# Patient Record
Sex: Male | Born: 1935 | Race: White | Hispanic: No | Smoking: Former smoker
Health system: Southern US, Community
[De-identification: ages and names within clinical notes are randomized; demographics above are authoritative.]

## PROBLEM LIST (undated history)

## (undated) DIAGNOSIS — R030 Elevated blood-pressure reading, without diagnosis of hypertension: Secondary | ICD-10-CM

## (undated) DIAGNOSIS — F0390 Unspecified dementia without behavioral disturbance: Secondary | ICD-10-CM

## (undated) DIAGNOSIS — IMO0001 Reserved for inherently not codable concepts without codable children: Secondary | ICD-10-CM

## (undated) HISTORY — DX: Elevated blood-pressure reading, without diagnosis of hypertension: R03.0

## (undated) HISTORY — PX: MELANOMA EXCISION: SHX5266

## (undated) HISTORY — PX: CYST EXCISION: SHX5701

## (undated) HISTORY — DX: Reserved for inherently not codable concepts without codable children: IMO0001

## (undated) HISTORY — PX: CATARACT EXTRACTION, BILATERAL: SHX1313

## (undated) HISTORY — PX: COLONOSCOPY: SHX174

---

## 1968-03-05 HISTORY — PX: HERNIA REPAIR: SHX51

## 2006-10-25 ENCOUNTER — Ambulatory Visit: Payer: Self-pay | Admitting: Gastroenterology

## 2013-01-26 ENCOUNTER — Ambulatory Visit: Payer: Self-pay | Admitting: Family Medicine

## 2013-06-26 ENCOUNTER — Ambulatory Visit: Payer: Self-pay | Admitting: Orthopedic Surgery

## 2013-07-04 ENCOUNTER — Emergency Department: Payer: Self-pay | Admitting: Emergency Medicine

## 2014-04-17 ENCOUNTER — Emergency Department: Payer: Self-pay | Admitting: Emergency Medicine

## 2014-04-17 DIAGNOSIS — S0093XA Contusion of unspecified part of head, initial encounter: Secondary | ICD-10-CM | POA: Diagnosis not present

## 2014-04-17 DIAGNOSIS — S0990XA Unspecified injury of head, initial encounter: Secondary | ICD-10-CM | POA: Diagnosis not present

## 2014-04-17 DIAGNOSIS — S0191XA Laceration without foreign body of unspecified part of head, initial encounter: Secondary | ICD-10-CM | POA: Diagnosis not present

## 2014-04-17 DIAGNOSIS — S01112A Laceration without foreign body of left eyelid and periocular area, initial encounter: Secondary | ICD-10-CM | POA: Diagnosis not present

## 2014-04-17 DIAGNOSIS — S80212A Abrasion, left knee, initial encounter: Secondary | ICD-10-CM | POA: Diagnosis not present

## 2014-04-17 DIAGNOSIS — Z87891 Personal history of nicotine dependence: Secondary | ICD-10-CM | POA: Diagnosis not present

## 2014-04-19 DIAGNOSIS — I10 Essential (primary) hypertension: Secondary | ICD-10-CM | POA: Diagnosis not present

## 2014-04-19 DIAGNOSIS — I350 Nonrheumatic aortic (valve) stenosis: Secondary | ICD-10-CM | POA: Diagnosis not present

## 2014-04-19 DIAGNOSIS — I2510 Atherosclerotic heart disease of native coronary artery without angina pectoris: Secondary | ICD-10-CM | POA: Diagnosis not present

## 2014-04-23 DIAGNOSIS — S01112S Laceration without foreign body of left eyelid and periocular area, sequela: Secondary | ICD-10-CM | POA: Diagnosis not present

## 2014-04-27 DIAGNOSIS — I350 Nonrheumatic aortic (valve) stenosis: Secondary | ICD-10-CM | POA: Diagnosis not present

## 2014-04-27 DIAGNOSIS — I10 Essential (primary) hypertension: Secondary | ICD-10-CM | POA: Diagnosis not present

## 2014-04-27 DIAGNOSIS — E782 Mixed hyperlipidemia: Secondary | ICD-10-CM | POA: Diagnosis not present

## 2014-04-27 DIAGNOSIS — I2510 Atherosclerotic heart disease of native coronary artery without angina pectoris: Secondary | ICD-10-CM | POA: Diagnosis not present

## 2014-06-10 DIAGNOSIS — R011 Cardiac murmur, unspecified: Secondary | ICD-10-CM | POA: Diagnosis not present

## 2014-06-10 DIAGNOSIS — R5383 Other fatigue: Secondary | ICD-10-CM | POA: Diagnosis not present

## 2014-06-10 DIAGNOSIS — I10 Essential (primary) hypertension: Secondary | ICD-10-CM | POA: Diagnosis not present

## 2014-06-10 LAB — BASIC METABOLIC PANEL
BUN: 17 mg/dL (ref 4–21)
Creatinine: 1.1 mg/dL (ref 0.6–1.3)
Glucose: 94 mg/dL
Potassium: 4.6 mmol/L (ref 3.4–5.3)
Sodium: 143 mmol/L (ref 137–147)

## 2014-06-10 LAB — TSH: TSH: 4.28 u[IU]/mL (ref 0.41–5.90)

## 2014-06-10 LAB — CBC AND DIFFERENTIAL
HEMATOCRIT: 39 % — AB (ref 41–53)
HEMOGLOBIN: 12.9 g/dL — AB (ref 13.5–17.5)
Neutrophils Absolute: 4 /uL
PLATELETS: 214 10*3/uL (ref 150–399)
WBC: 7.6 10^3/mL

## 2014-06-10 LAB — HEPATIC FUNCTION PANEL
ALT: 11 U/L (ref 10–40)
AST: 15 U/L (ref 14–40)
Alkaline Phosphatase: 88 U/L (ref 25–125)
Bilirubin, Total: 0.4 mg/dL

## 2014-08-19 ENCOUNTER — Ambulatory Visit (INDEPENDENT_AMBULATORY_CARE_PROVIDER_SITE_OTHER): Payer: Medicare Other | Admitting: Family Medicine

## 2014-08-19 ENCOUNTER — Encounter: Payer: Self-pay | Admitting: Family Medicine

## 2014-08-19 VITALS — BP 148/62 | HR 67 | Temp 97.8°F | Resp 16 | Wt 164.2 lb

## 2014-08-19 DIAGNOSIS — K4091 Unilateral inguinal hernia, without obstruction or gangrene, recurrent: Secondary | ICD-10-CM | POA: Diagnosis not present

## 2014-08-19 DIAGNOSIS — N481 Balanitis: Secondary | ICD-10-CM | POA: Diagnosis not present

## 2014-08-19 MED ORDER — NYSTATIN 100000 UNIT/GM EX CREA: 1.0000 "application " | TOPICAL_CREAM | Freq: Two times a day (BID) | CUTANEOUS | Status: DC

## 2014-08-19 NOTE — Patient Instructions (Addendum)
Call for referral if hernia becomes bigger or painful Powder genital area after a bath to minimize recurrence of rash

## 2014-08-19 NOTE — Progress Notes (Signed)
Subjective:     Patient ID: Derek Hancock, male   DOB: [birth year], 81 y.o.   MRN: [id number]  HPI  Chief Complaint  Patient presents with  . Skin Problem    Patient comes in office today to discuss redeness and irritation around foreskin of penis he has redness for the past 2 weeks.   Reports that he had similar rash a few years ago which got better with an rx cream. Also has had recurrence of a right inguinal hernia which he states was repaired previously in the 1970's. Not sexually active and is doing little lifting these days per his report: "My kids do it all."   Review of Systems  Constitutional: Negative for fever and chills.  Genitourinary: Negative for dysuria.       Objective:   Physical Exam  Abdominal: A hernia is present. Hernia confirmed positive in the right inguinal area.  Genitourinary: Circumcised. Penile erythema (mild erythema with slight scaling on glans and around corona) present.       Assessment:     1. Balanitis-mild  - nystatin cream (MYCOSTATIN); Apply 1 application topically 2 (two) times daily.  Dispense: 30 g; Refill: 0  2. Unilateral recurrent inguinal hernia without obstruction or gangrene     Plan:    Will monitor hernia for pain or enlargement-patient does not wish surgery at this time. Dicussed powdering groin area after bath

## 2014-09-15 ENCOUNTER — Encounter: Payer: Self-pay | Admitting: Family Medicine

## 2014-09-15 ENCOUNTER — Ambulatory Visit (INDEPENDENT_AMBULATORY_CARE_PROVIDER_SITE_OTHER): Payer: Medicare Other | Admitting: Family Medicine

## 2014-09-15 VITALS — BP 142/60 | HR 64 | Temp 97.4°F | Resp 16 | Wt 165.0 lb

## 2014-09-15 DIAGNOSIS — N481 Balanitis: Secondary | ICD-10-CM

## 2014-09-15 DIAGNOSIS — R011 Cardiac murmur, unspecified: Secondary | ICD-10-CM | POA: Insufficient documentation

## 2014-09-15 DIAGNOSIS — Z85820 Personal history of malignant melanoma of skin: Secondary | ICD-10-CM

## 2014-09-15 DIAGNOSIS — Z87891 Personal history of nicotine dependence: Secondary | ICD-10-CM | POA: Insufficient documentation

## 2014-09-15 DIAGNOSIS — I10 Essential (primary) hypertension: Secondary | ICD-10-CM | POA: Insufficient documentation

## 2014-09-15 NOTE — Progress Notes (Signed)
Patient ID: Derek GuthrieGeorge D Mullenbach, male   DOB: 01-27-1922, 81 y.o.   MRN: [id number]    Subjective:  HPI Pt comes in today because he is having penile pain. He reports that it is red and irriated around the foreskin of his penis. He saw Nadine CountsBob on 08/19/14 and was given Nystatin  Cream and he said that did not work and stopped using it. He reports that some times it burns when he voids.     Prior to Admission medications   Medication Sig Start Date End Date Taking? Authorizing Provider  acetaminophen (TYLENOL) 325 MG tablet Take by mouth.   Yes Historical Provider, MD    Patient Active Problem List   Diagnosis Date Noted  . Blood pressure elevated 09/15/2014  . History of tobacco use 09/15/2014  . Cardiac murmur 09/15/2014    Past Medical History  Diagnosis Date  . Elevated blood pressure     History   Social History  . Marital Status: Married    Spouse Name: N/A  . Number of Children: N/A  . Years of Education: N/A   Occupational History  . Not on file.   Social History Main Topics  . Smoking status: Former Smoker    Quit date: 03/05/1968  . Smokeless tobacco: Not on file  . Alcohol Use: Not on file  . Drug Use: Not on file  . Sexual Activity: Not on file   Other Topics Concern  . Not on file   Social History Narrative    No Known Allergies  Review of Systems  Constitutional: Negative.   HENT: Negative.   Eyes: Negative.   Respiratory: Negative.   Gastrointestinal: Negative.   Genitourinary:       Penile pain and redness  Musculoskeletal: Negative.   Skin: Negative.   Neurological: Negative.   Endo/Heme/Allergies: Negative.   Psychiatric/Behavioral: Negative.     Immunization History  Administered Date(s) Administered  . Td 02/01/2014   Objective:  BP 142/60 mmHg  Pulse 64  Temp(Src) 97.4 F (36.3 C) (Oral)  Resp 16  Wt 165 lb (74.844 kg)  Physical Exam  Constitutional: He is oriented to person, place, and time.  Genitourinary: Penis normal.    Circumcised gentleman. Mild irritation and skin changes consistent with healing balanitis.  Neurological: He is alert and oriented to person, place, and time.  Skin: Skin is warm.  Psychiatric: Mood and affect normal.    Lab Results  Component Value Date   WBC 7.6 06/10/2014   HGB 12.9* 06/10/2014   HCT 39* 06/10/2014   PLT 214 06/10/2014   TSH 4.28 06/10/2014    CMP     Component Value Date/Time   NA 143 06/10/2014   K 4.6 06/10/2014   BUN 17 06/10/2014   CREATININE 1.1 06/10/2014   AST 15 06/10/2014   ALT 11 06/10/2014   ALKPHOS 88 06/10/2014    Assessment and Plan :  Yeast balanitis  History of m elanoma Saw Dr. Orson AloeHenderson. Diagnosed probably 8-10 years ago.  Refer back to his practice. Mild cognitive impairment/early dementia  Julieanne Mansonichard  MD [facility] 09/15/2014 9:40 AM

## 2014-12-15 ENCOUNTER — Encounter: Payer: Self-pay | Admitting: Family Medicine

## 2015-07-11 ENCOUNTER — Telehealth: Payer: Self-pay | Admitting: Family Medicine

## 2015-07-11 NOTE — Telephone Encounter (Signed)
Patient has been notified, follow up appt with Dr. Sullivan LoneGilbert has been arranged

## 2015-07-11 NOTE — Telephone Encounter (Signed)
Wrong provider-aa

## 2015-07-11 NOTE — Telephone Encounter (Signed)
This is a Dr. Sullivan LoneGilbert patient and should be seen by him.

## 2015-07-11 NOTE — Telephone Encounter (Signed)
Dr Sullivan Lonegilbert states patient and son needs to be seen to discuss issues. Son advised through my chart-aa

## 2015-07-11 NOTE — Telephone Encounter (Signed)
FYI

## 2015-07-11 NOTE — Telephone Encounter (Signed)
Son called wanting to talk to you about his father aging and living home by himself and direction for which they need to go.  Some memory loss.  Still gets around ok for 89 but they are just concerned about him falling or something else happening.  His call back is   [phone]  Thanks, Barth Kirksteri

## 2015-07-14 ENCOUNTER — Ambulatory Visit (INDEPENDENT_AMBULATORY_CARE_PROVIDER_SITE_OTHER): Payer: Medicare Other | Admitting: Family Medicine

## 2015-07-14 VITALS — BP 182/78 | HR 68 | Temp 97.7°F | Resp 18 | Wt 164.0 lb

## 2015-07-14 DIAGNOSIS — G459 Transient cerebral ischemic attack, unspecified: Secondary | ICD-10-CM | POA: Diagnosis not present

## 2015-07-14 DIAGNOSIS — I10 Essential (primary) hypertension: Secondary | ICD-10-CM

## 2015-07-14 DIAGNOSIS — E785 Hyperlipidemia, unspecified: Secondary | ICD-10-CM | POA: Diagnosis not present

## 2015-07-14 DIAGNOSIS — G309 Alzheimer's disease, unspecified: Secondary | ICD-10-CM | POA: Diagnosis not present

## 2015-07-14 DIAGNOSIS — F0280 Dementia in other diseases classified elsewhere without behavioral disturbance: Secondary | ICD-10-CM

## 2015-07-14 MED ORDER — HYDROCHLOROTHIAZIDE 12.5 MG PO CAPS: 12.5000 mg | ORAL_CAPSULE | Freq: Every day | ORAL | Status: DC

## 2015-07-14 NOTE — Progress Notes (Signed)
Patient ID: Durenda GuthrieGeorge D Wivell, male   DOB: [birth year], 82 y.o.   MRN: [id number]   Durenda GuthrieGeorge D Bansal  MRN: [id number] DOB: [birth year]  Subjective:  HPI  The patient is a 82 year old male who is brought in with his 2 sons.  They described the patient as having an event while at the beach last week that they think may have been a TIA.  They described the patient walking on the beach and then he was having to walk very fast as he was leaning forward.  They state his legs got weak and were about to go out under him, he was then dependent on their help.  When the appointment was made it was put down that he needed to have his memory checked.  An MMSE was performed on the patient and he scored 13.5/30  It is of note that the patient is still driving at this time.  Patient Active Problem List   Diagnosis Date Noted  . Blood pressure elevated 09/15/2014  . History of tobacco use 09/15/2014  . Cardiac murmur 09/15/2014    Past Medical History  Diagnosis Date  . Elevated blood pressure     Social History   Social History  . Marital Status: Married    Spouse Name: N/A  . Number of Children: N/A  . Years of Education: N/A   Occupational History  . Not on file.   Social History Main Topics  . Smoking status: Former Smoker    Quit date: 03/05/1968  . Smokeless tobacco: Not on file  . Alcohol Use: Not on file  . Drug Use: Not on file  . Sexual Activity: Not on file   Other Topics Concern  . Not on file   Social History Narrative    Outpatient Prescriptions Prior to Visit  Medication Sig Dispense Refill  . acetaminophen (TYLENOL) 325 MG tablet Take by mouth.     No facility-administered medications prior to visit.    No Known Allergies  Review of Systems  Constitutional: Negative for fever and malaise/fatigue.  Eyes: Negative.   Respiratory: Negative for cough, shortness of breath and wheezing.   Cardiovascular: Positive for leg swelling (ankleschronic and unchanged). Negative  for chest pain, palpitations, orthopnea and claudication.  Gastrointestinal: Negative.   Neurological: Positive for weakness. Negative for dizziness, speech change, seizures, loss of consciousness and headaches.  Psychiatric/Behavioral: Positive for depression and memory loss. Negative for suicidal ideas, hallucinations and substance abuse. The patient is not nervous/anxious and does not have insomnia.    Objective:  BP 182/78 mmHg  Pulse 68  Temp(Src) 97.7 F (36.5 C) (Oral)  Resp 18  Wt 164 lb (74.39 kg)  Physical Exam  Constitutional: He is oriented to person, place, and time and well-developed, well-nourished, and in no distress.  HENT:  Head: Normocephalic and atraumatic.  Right Ear: External ear normal.  Left Ear: External ear normal.  Nose: Nose normal.  Neck: Neck supple.  Cardiovascular: Normal rate, regular rhythm and normal heart sounds.    2 left carotid bruit versus radiating murmur.  2/6 systolic murmur at right upper sternal border  Pulmonary/Chest: Effort normal and breath sounds normal.  Abdominal: Soft.  Neurological: He is alert and oriented to person, place, and time. Gait normal.  Skin: Skin is warm and dry.  Psychiatric: Mood and affect normal.    Assessment and Plan :  No diagnosis found.  hypertension  Both sons are with patient today and wish to not treat it  just from today's readings. They  feel as though that he has white coat hypertension. After long discussion we'll start HCTZ 12.5 mg daily  TIA   clinically it does fit the patient had a slight TIA last week at the beach with his family. The pressure as above. May need carotid Dopplers.   aspirin daily imaging or neurology referral at this time.   Alzheimer's disease  Clinical presentation of of cognitive impairment fits Alzheimer's. Happy to refer to patient if family wishes. Other than carotid Dopplers do not think imaging is necessary. Definitely instructed patient and his sons that he should  not be driving.  I offered home health referral but he declined. I will see him back in 1 month regarding these issues. I have done the exam and reviewed the above chart and it is accurate to the best of my knowledge.  Julieanne Manson MD [facility] 07/14/2015 4:17 PM

## 2015-07-19 DIAGNOSIS — G459 Transient cerebral ischemic attack, unspecified: Secondary | ICD-10-CM | POA: Diagnosis not present

## 2015-07-19 DIAGNOSIS — E785 Hyperlipidemia, unspecified: Secondary | ICD-10-CM | POA: Diagnosis not present

## 2015-07-19 DIAGNOSIS — G309 Alzheimer's disease, unspecified: Secondary | ICD-10-CM | POA: Diagnosis not present

## 2015-07-19 DIAGNOSIS — I10 Essential (primary) hypertension: Secondary | ICD-10-CM | POA: Diagnosis not present

## 2015-07-20 LAB — COMPREHENSIVE METABOLIC PANEL
ALBUMIN: 4.3 g/dL (ref 3.2–4.6)
ALK PHOS: 78 IU/L (ref 39–117)
ALT: 8 IU/L (ref 0–44)
AST: 13 IU/L (ref 0–40)
Albumin/Globulin Ratio: 2 (ref 1.2–2.2)
BUN/Creatinine Ratio: 21 (ref 10–24)
BUN: 25 mg/dL (ref 10–36)
Bilirubin Total: 0.4 mg/dL (ref 0.0–1.2)
CO2: 27 mmol/L (ref 18–29)
CREATININE: 1.2 mg/dL (ref 0.76–1.27)
Calcium: 9.1 mg/dL (ref 8.6–10.2)
Chloride: 98 mmol/L (ref 96–106)
GFR calc Af Amer: 61 mL/min/{1.73_m2} (ref >59)
GFR calc non Af Amer: 53 mL/min/{1.73_m2} — ABNORMAL LOW (ref >59)
GLUCOSE: 89 mg/dL (ref 65–99)
Globulin, Total: 2.1 g/dL (ref 1.5–4.5)
Potassium: 4.2 mmol/L (ref 3.5–5.2)
Sodium: 140 mmol/L (ref 134–144)
Total Protein: 6.4 g/dL (ref 6.0–8.5)

## 2015-07-20 LAB — CBC WITH DIFFERENTIAL/PLATELET
BASOS ABS: 0 10*3/uL (ref 0.0–0.2)
Basos: 0 %
EOS (ABSOLUTE): 0.1 10*3/uL (ref 0.0–0.4)
Eos: 1 %
HEMOGLOBIN: 12.1 g/dL — AB (ref 12.6–17.7)
Hematocrit: 36.8 % — ABNORMAL LOW (ref 37.5–51.0)
IMMATURE GRANULOCYTES: 0 %
Immature Grans (Abs): 0 10*3/uL (ref 0.0–0.1)
LYMPHS ABS: 2.3 10*3/uL (ref 0.7–3.1)
LYMPHS: 24 %
MCH: 30.9 pg (ref 26.6–33.0)
MCHC: 32.9 g/dL (ref 31.5–35.7)
MCV: 94 fL (ref 79–97)
MONOCYTES: 9 %
Monocytes Absolute: 0.8 10*3/uL (ref 0.1–0.9)
NEUTROS PCT: 66 %
Neutrophils Absolute: 6.4 10*3/uL (ref 1.4–7.0)
Platelets: 205 10*3/uL (ref 150–379)
RBC: 3.92 x10E6/uL — AB (ref 4.14–5.80)
RDW: 14.2 % (ref 12.3–15.4)
WBC: 9.7 10*3/uL (ref 3.4–10.8)

## 2015-07-20 LAB — LIPID PANEL
CHOLESTEROL TOTAL: 168 mg/dL (ref 100–199)
Chol/HDL Ratio: 2.3 ratio units (ref 0.0–5.0)
HDL: 74 mg/dL (ref >39)
LDL CALC: 77 mg/dL (ref 0–99)
TRIGLYCERIDES: 83 mg/dL (ref 0–149)
VLDL CHOLESTEROL CAL: 17 mg/dL (ref 5–40)

## 2015-07-20 LAB — TSH: TSH: 2.27 u[IU]/mL (ref 0.450–4.500)

## 2015-07-27 DIAGNOSIS — L570 Actinic keratosis: Secondary | ICD-10-CM | POA: Diagnosis not present

## 2015-07-27 DIAGNOSIS — L821 Other seborrheic keratosis: Secondary | ICD-10-CM | POA: Diagnosis not present

## 2015-07-27 DIAGNOSIS — Z86008 Personal history of in-situ neoplasm of other site: Secondary | ICD-10-CM | POA: Diagnosis not present

## 2015-08-11 ENCOUNTER — Ambulatory Visit (INDEPENDENT_AMBULATORY_CARE_PROVIDER_SITE_OTHER): Payer: Medicare Other | Admitting: Family Medicine

## 2015-08-11 ENCOUNTER — Encounter: Payer: Self-pay | Admitting: Family Medicine

## 2015-08-11 VITALS — BP 144/64 | HR 57 | Temp 97.4°F | Resp 16 | Wt 163.0 lb

## 2015-08-11 DIAGNOSIS — R0989 Other specified symptoms and signs involving the circulatory and respiratory systems: Secondary | ICD-10-CM

## 2015-08-11 DIAGNOSIS — F0390 Unspecified dementia without behavioral disturbance: Secondary | ICD-10-CM

## 2015-08-11 DIAGNOSIS — I10 Essential (primary) hypertension: Secondary | ICD-10-CM

## 2015-08-11 MED ORDER — HYDROCHLOROTHIAZIDE 12.5 MG PO CAPS: 12.5000 mg | ORAL_CAPSULE | Freq: Every day | ORAL | Status: DC

## 2015-08-11 MED ORDER — DONEPEZIL HCL 5 MG PO TABS: 5.0000 mg | ORAL_TABLET | Freq: Every day | ORAL | Status: DC

## 2015-08-11 NOTE — Progress Notes (Signed)
Patient: Derek Hancock Male    DOB: 01-07-1922   82 y.o.   MRN: [id number] Visit Date: 08/11/2015  Today's Provider: Megan Mansichard Gilbert Jr, MD   Chief Complaint  Patient presents with  . Follow-up  . Hypertension   Subjective:    HPI    Alzheimer's disease Clinical presentation of of cognitive impairment fits Alzheimer's. Happy to refer to patient if family wishes. Other than carotid Dopplers do not think imaging is necessary. Definitely instructed patient and his sons that he should not be driving. I offered home health referral but he declined. I will see him back in 1 month regarding these issues.   TIA  May need carotid Dopplers. 81mg  aspirin daily imaging or neurology referral at this time.      Hypertension, follow-up:  BP Readings from Last 3 Encounters:  08/11/15 144/64  07/14/15 182/78  09/15/14 142/60    He was last seen for hypertension 1 months ago.  BP at that visit was 182/78. Management since that visit includes; started HCTZ 12.5 mg qd .He reports good compliance with treatment. He is not having side effects. none  He is not exercising. He is adherent to low salt diet.   Outside blood pressures are n/a. He is experiencing none.  Patient denies none.   Cardiovascular risk factors include none.  Use of agents associated with hypertension: none.   ----------------------------------------------------------------------     No Known Allergies Current Meds  Medication Sig  . acetaminophen (TYLENOL) 325 MG tablet Take by mouth.  Marland Kitchen. aspirin 81 MG tablet Take 81 mg by mouth daily.  . hydrochlorothiazide (MICROZIDE) 12.5 MG capsule Take 1 capsule (12.5 mg total) by mouth daily.    Review of Systems  Constitutional: Negative for fever, chills and appetite change.  Respiratory: Negative for chest tightness, shortness of breath and wheezing.   Cardiovascular: Negative for chest pain and palpitations.  Gastrointestinal: Negative for nausea,  vomiting and abdominal pain.    Social History  Substance Use Topics  . Smoking status: Former Smoker    Quit date: 03/05/1968  . Smokeless tobacco: Not on file  . Alcohol Use: Not on file   Objective:   BP 144/64 mmHg  Pulse 57  Temp(Src) 97.4 F (36.3 C) (Oral)  Resp 16  Wt 163 lb (73.936 kg)  SpO2 99%  Physical Exam  Constitutional: He appears well-developed and well-nourished.  HENT:  Head: Normocephalic and atraumatic.  Right Ear: External ear normal.  Left Ear: External ear normal.  Nose: Nose normal.  Eyes: Conjunctivae are normal.  Neck: Neck supple. No thyromegaly present.  Cardiovascular: Normal rate, regular rhythm and normal heart sounds.   2/6 murmur.  Pulmonary/Chest: Effort normal and breath sounds normal.  Abdominal: Soft.  Neurological: He is alert. No cranial nerve deficit. He exhibits normal muscle tone. Coordination normal.  Skin: Skin is warm and dry.  Psychiatric: He has a normal mood and affect. His behavior is normal. Judgment and thought content normal.        Assessment & Plan:     1. Essential hypertension Much better control on low-dose diuretic. - hydrochlorothiazide (MICROZIDE) 12.5 MG capsule; Take 1 capsule (12.5 mg total) by mouth daily.  Dispense: 90 capsule; Refill: 5  2. Dementia, without behavioral disturbance MMSE on next OV.Patient is doing well. Family is very kind and supportive. He is doing fine not driving. - donepezil (ARICEPT) 5 MG tablet; Take 1 tablet (5 mg total) by mouth at bedtime.  Dispense: 30 tablet; Refill: 12  3. Bruit of left carotid artery Could be radiating  murmur but need to make sure. - US Carotid Bilateral; Future       Megan Mans, MD  [facility]

## 2015-08-19 ENCOUNTER — Ambulatory Visit
Admission: RE | Admit: 2015-08-19 | Discharge: 2015-08-19 | Disposition: A | Discharge status: Home / Self Care | Payer: Medicare Other | Source: Ambulatory Visit | Attending: Family Medicine | Admitting: Family Medicine

## 2015-08-19 DIAGNOSIS — R0989 Other specified symptoms and signs involving the circulatory and respiratory systems: Secondary | ICD-10-CM | POA: Insufficient documentation

## 2015-08-19 DIAGNOSIS — I6523 Occlusion and stenosis of bilateral carotid arteries: Secondary | ICD-10-CM | POA: Diagnosis not present

## 2015-08-29 DIAGNOSIS — I2510 Atherosclerotic heart disease of native coronary artery without angina pectoris: Secondary | ICD-10-CM | POA: Diagnosis not present

## 2015-08-29 DIAGNOSIS — I351 Nonrheumatic aortic (valve) insufficiency: Secondary | ICD-10-CM | POA: Diagnosis not present

## 2015-08-29 DIAGNOSIS — I350 Nonrheumatic aortic (valve) stenosis: Secondary | ICD-10-CM | POA: Diagnosis not present

## 2015-08-29 DIAGNOSIS — I10 Essential (primary) hypertension: Secondary | ICD-10-CM | POA: Diagnosis not present

## 2015-08-29 DIAGNOSIS — I340 Nonrheumatic mitral (valve) insufficiency: Secondary | ICD-10-CM | POA: Diagnosis not present

## 2015-10-20 DIAGNOSIS — M898X9 Other specified disorders of bone, unspecified site: Secondary | ICD-10-CM | POA: Diagnosis not present

## 2015-10-20 DIAGNOSIS — M79674 Pain in right toe(s): Secondary | ICD-10-CM | POA: Diagnosis not present

## 2015-10-20 DIAGNOSIS — M79675 Pain in left toe(s): Secondary | ICD-10-CM | POA: Diagnosis not present

## 2015-10-20 DIAGNOSIS — M2042 Other hammer toe(s) (acquired), left foot: Secondary | ICD-10-CM | POA: Diagnosis not present

## 2015-10-20 DIAGNOSIS — B351 Tinea unguium: Secondary | ICD-10-CM | POA: Diagnosis not present

## 2016-01-11 ENCOUNTER — Encounter: Payer: Self-pay | Admitting: Family Medicine

## 2016-01-11 ENCOUNTER — Ambulatory Visit (INDEPENDENT_AMBULATORY_CARE_PROVIDER_SITE_OTHER): Payer: Medicare Other | Admitting: Family Medicine

## 2016-01-11 VITALS — BP 142/62 | HR 66 | Temp 97.7°F | Ht 67.0 in | Wt 160.2 lb

## 2016-01-11 VITALS — BP 156/62 | HR 60 | Resp 16

## 2016-01-11 DIAGNOSIS — F0390 Unspecified dementia without behavioral disturbance: Secondary | ICD-10-CM | POA: Diagnosis not present

## 2016-01-11 DIAGNOSIS — I350 Nonrheumatic aortic (valve) stenosis: Secondary | ICD-10-CM

## 2016-01-11 DIAGNOSIS — Z0000 Encounter for general adult medical examination without abnormal findings: Secondary | ICD-10-CM

## 2016-01-11 DIAGNOSIS — I10 Essential (primary) hypertension: Secondary | ICD-10-CM

## 2016-01-11 DIAGNOSIS — K4090 Unilateral inguinal hernia, without obstruction or gangrene, not specified as recurrent: Secondary | ICD-10-CM

## 2016-01-11 MED ORDER — DONEPEZIL HCL 10 MG PO TABS: 10.0000 mg | ORAL_TABLET | Freq: Every day | ORAL | 3 refills | Status: DC

## 2016-01-11 NOTE — Progress Notes (Signed)
Subjective:   Derek Hancock is a 82 y.o. male who presents for Medicare Annual/Subsequent preventive examination.  Review of Systems:  Progressive memory loss/dementia. Patient is brought in by his son. He is no longer driving. Cardiac Risk Factors include: advanced age (>7455men, 12>65 women);hypertension;male gender     Objective:    Vitals: BP (!) 142/62 (BP Location: Right Arm)   Pulse 66   Temp 97.7 F (36.5 C) (Oral)   Ht 5\' 7"  (1.702 m)   Wt 160 lb 4 oz (72.7 kg)   BMI 25.10 kg/m   Body mass index is 25.1 kg/m.  Tobacco History  Smoking Status  . Former Smoker  . Quit date: 03/05/1968  Smokeless Tobacco  . Never Used     Counseling given: Not Answered   Past Medical History:  Diagnosis Date  . Elevated blood pressure    Past Surgical History:  Procedure Laterality Date  . CATARACT EXTRACTION, BILATERAL    . CATARACT EXTRACTION, BILATERAL    . CYST EXCISION     at the end of the spine  . HERNIA REPAIR  1970  . MELANOMA EXCISION    . MELANOMA EXCISION     Family History  Problem Relation Age of Onset  . Heart disease Mother   . Heart disease Father    History  Sexual Activity  . Sexual activity: Not on file    Outpatient Encounter Prescriptions as of 01/11/2016  Medication Sig  . acetaminophen (TYLENOL) 325 MG tablet Take by mouth.  Marland Kitchen. aspirin 81 MG tablet Take 81 mg by mouth daily.  Marland Kitchen. donepezil (ARICEPT) 5 MG tablet Take 1 tablet (5 mg total) by mouth at bedtime.  . hydrochlorothiazide (MICROZIDE) 12.5 MG capsule Take 1 capsule (12.5 mg total) by mouth daily.   No facility-administered encounter medications on file as of 01/11/2016.     Activities of Daily Living In your present state of health, do you have any difficulty performing the following activities: 01/11/2016 07/14/2015  Hearing? N N  Vision? N N  Difficulty concentrating or making decisions? N Y  Walking or climbing stairs? N N  Dressing or bathing? N N  Doing errands, shopping? N N    Preparing Food and eating ? N -  Using the Toilet? N -  In the past six months, have you accidently leaked urine? N -  Do you have problems with loss of bowel control? N -  Managing your Medications? N -  Managing your Finances? (No Data) -  Housekeeping or managing your Housekeeping? Y -  Some recent data might be hidden    Patient Care Team: Derek Hudsonichard L Hancock Jr., MD as PCP - General (Family Medicine) Derek PereyraPhillip T. Alvester MorinBell, MD as Consulting Physician (Ophthalmology) Derek BlinksBruce J Kowalski, MD as Consulting Physician (Cardiology)   Assessment:     Exercise Activities and Dietary recommendations Current Exercise Habits: The patient does not participate in regular exercise at present, Exercise limited by: None identified (does not drive)  Goals    . Increase water intake          Starting 01/11/16, I will increase my water intake to 4 glasses a day.       Fall Risk Fall Risk  01/11/2016 07/14/2015  Falls in the past year? No Yes  Number falls in past yr: - 1  Injury with Fall? - No   Depression Screen PHQ 2/9 Scores 01/11/2016  PHQ - 2 Score 0    Cognitive Function  6CIT Screen 01/11/2016  What Year? 4 points  What month? 0 points  What time? 0 points  Count back from 20 2 points  Months in reverse 4 points  Repeat phrase 6 points  Total Score 16    Immunization History  Administered Date(s) Administered  . Influenza-Unspecified 11/30/2015  . Td 02/01/2014   Screening Tests Health Maintenance  Topic Date Due  . PNA vac Low Risk Adult (1 of 2 - PCV13) 03/04/2016 (Originally 10/14/1988)  . ZOSTAVAX  01/10/2017 (Originally 10/15/1983)  . TETANUS/TDAP  02/02/2024  . INFLUENZA VACCINE  Completed      Plan:  I have personally reviewed and addressed the Medicare Annual Wellness questionnaire and have noted the following in the patient's chart:  A. Medical and social history B. Use of alcohol, tobacco or illicit drugs  C. Current medications and  supplements D. Functional ability and status E.  Nutritional status F.  Physical activity G. Advance directives H. List of other physicians I.  Hospitalizations, surgeries, and ER visits in previous 12 months J.  Vitals K. Screenings such as hearing and vision if needed, cognitive and depression L. Referrals and appointments - none  In addition, I have reviewed and discussed with patient certain preventive protocols, quality metrics, and best practice recommendations. A written personalized care plan for preventive services as well as general preventive health recommendations were provided to patient.  See attached scanned questionnaire for additional information.   Signed,  Derek Hancock, Derek Hancock  I have reviewed the information as  put it by [facility]big[facility] Radich Hancock and was available for any questions or concerns. Derek Hancock

## 2016-01-11 NOTE — Patient Instructions (Signed)
Mr. Derek Hancock , Thank you for taking time to come for your Medicare Wellness Visit. I appreciate your ongoing commitment to your health goals. Please review the following plan we discussed and let me know if I can assist you in the future.   These are the goals we discussed: Goals    . Increase water intake          Starting 01/11/16, I will increase my water intake to 4 glasses a day.        This is a list of the screening recommended for you and due dates:  Health Maintenance  Topic Date Due  . Pneumonia vaccines (1 of 2 - PCV13) 03/04/2016*  . Shingles Vaccine  01/10/2017*  . Tetanus Vaccine  02/02/2024  . Flu Shot  Completed  *Topic was postponed. The date shown is not the original due date.   Preventive Care for Adults  A healthy lifestyle and preventive care can promote health and wellness. Preventive health guidelines for adults include the following key practices.  . A routine yearly physical is a good way to check with your health care provider about your health and preventive screening. It is a chance to share any concerns and updates on your health and to receive a thorough exam.  . Visit your dentist for a routine exam and preventive care every 6 months. Brush your teeth twice a day and floss once a day. Good oral hygiene prevents tooth decay and gum disease.  . The frequency of eye exams is based on your age, health, family medical history, use  of contact lenses, and other factors. Follow your health care provider's ecommendations for frequency of eye exams.  . Eat a healthy diet. Foods like vegetables, fruits, whole grains, low-fat dairy products, and lean protein foods contain the nutrients you need without too many calories. Decrease your intake of foods high in solid fats, added sugars, and salt. Eat the right amount of calories for you. Get information about a proper diet from your health care provider, if necessary.  . Regular physical exercise is one of the most  important things you can do for your health. Most adults should get at least 150 minutes of moderate-intensity exercise (any activity that increases your heart rate and causes you to sweat) each week. In addition, most adults need muscle-strengthening exercises on 2 or more days a week.  Silver Sneakers may be a benefit available to you. To determine eligibility, you may visit the website: www.silversneakers.com or contact program at [phone] Mon-Fri between 8AM-8PM.   . Maintain a healthy weight. The body mass index (BMI) is a screening tool to identify possible weight problems. It provides an estimate of body fat based on height and weight. Your health care provider can find your BMI and can help you achieve or maintain a healthy weight.   For adults 20 years and older: ? A BMI below 18.5 is considered underweight. ? A BMI of 18.5 to 24.9 is normal. ? A BMI of 25 to 29.9 is considered overweight. ? A BMI of 30 and above is considered obese.   . Maintain normal blood lipids and cholesterol levels by exercising and minimizing your intake of saturated fat. Eat a balanced diet with plenty of fruit and vegetables. Blood tests for lipids and cholesterol should begin at age 80 and be repeated every 5 years. If your lipid or cholesterol levels are high, you are over 50, or you are at high risk for heart disease, you  may need your cholesterol levels checked more frequently. Ongoing high lipid and cholesterol levels should be treated with medicines if diet and exercise are not working.  . If you smoke, find out from your health care provider how to quit. If you do not use tobacco, please do not start.  . If you choose to drink alcohol, please do not consume more than 2 drinks per day. One drink is considered to be 12 ounces (355 mL) of beer, 5 ounces (148 mL) of wine, or 1.5 ounces (44 mL) of liquor.  . If you are 3055-658 years old, ask your health care provider if you should take aspirin to prevent  strokes.  . Use sunscreen. Apply sunscreen liberally and repeatedly throughout the day. You should seek shade when your shadow is shorter than you. Protect yourself by wearing long sleeves, pants, a wide-brimmed hat, and sunglasses year round, whenever you are outdoors.  . Once a month, do a whole body skin exam, using a mirror to look at the skin on your back. Tell your health care provider of new moles, moles that have irregular borders, moles that are larger than a pencil eraser, or moles that have changed in shape or color.

## 2016-01-11 NOTE — Progress Notes (Signed)
Patient: Derek Hancock Male    DOB: [birth year]   82 y.o.   MRN: [id number] Visit Date: 01/11/2016  Today's Provider: Megan Mansichard  Jr, MD   Chief Complaint  Patient presents with  . Hypertension  . Dementia   Subjective:    HPI The major quality-of-life issue is progressive dementia. Patient recognizes that his cognition is failing. Son is here with him. He is not driving and appears to be safe. He still lives in his his own home. New issue today is a discomfort and little bit of swelling when his he has been on his  feet in the groin area.     Hypertension, follow-up:  BP Readings from Last 3 Encounters:  01/11/16 (!) 156/62  01/11/16 (!) 142/62  08/11/15 (!) 144/64    He was last seen for hypertension 5 months ago.  BP at that visit was 144/64. Management since that visit includes no changes made. He reports excellent compliance with treatment. He is not having side effects.  He is exercising. He is adherent to low salt diet.   Outside blood pressures are not being checked. He is experiencing lower extremity edema.  Patient denies chest pain, dyspnea, fatigue, irregular heart beat, near-syncope, orthopnea, palpitations and syncope.   Cardiovascular risk factors include advanced age (older than 7055 for men, 7065 for women), hypertension and male gender.       Weight trend: decreasing steadily Wt Readings from Last 3 Encounters:  01/11/16 160 lb 4 oz (72.7 kg)  08/11/15 163 lb (73.9 kg)  07/14/15 164 lb (74.4 kg)    Current diet: in general, a "healthy" diet    ------------------------------------------------------------------------ Dementia Follow Up  MMSE - Mini Mental State Exam 01/11/2016  Orientation to time 2  Orientation to Place 4  Registration 1  Attention/ Calculation 0  Recall 2  Language- name 2 objects 2  Language- repeat 1  Language- follow 3 step command 3  Language- read & follow direction 1  Write a sentence 0  Copy design 0    Total score 16   Is taking Aricept 5 mg tablet daily for this.  No Known Allergies   Current Outpatient Prescriptions:  .  acetaminophen (TYLENOL) 325 MG tablet, Take by mouth., Disp: , Rfl:  .  aspirin 81 MG tablet, Take 81 mg by mouth daily., Disp: , Rfl:  .  donepezil (ARICEPT) 5 MG tablet, Take 1 tablet (5 mg total) by mouth at bedtime., Disp: 30 tablet, Rfl: 12 .  hydrochlorothiazide (MICROZIDE) 12.5 MG capsule, Take 1 capsule (12.5 mg total) by mouth daily., Disp: 90 capsule, Rfl: 5 .  FLUZONE HIGH-DOSE 0.5 ML SUSY, , Disp: , Rfl:   Review of Systems  Constitutional: Negative for activity change, appetite change, chills, diaphoresis, fatigue, fever and unexpected weight change.  Eyes: Negative.   Respiratory: Negative for shortness of breath.   Cardiovascular: Positive for leg swelling. Negative for chest pain and palpitations.  Endocrine: Negative.   Genitourinary:       Mild swelling and discomfort and groin recently.  Allergic/Immunologic: Negative.   Neurological: Negative.   Psychiatric/Behavioral: Negative.     Social History  Substance Use Topics  . Smoking status: Former Smoker    Quit date: 03/05/1968  . Smokeless tobacco: Never Used  . Alcohol use No   Objective:   BP (!) 156/62 (BP Location: Right Arm, Patient Position: Sitting, Cuff Size: Normal)   Pulse 60   Resp 16  Physical Exam  Constitutional: He is oriented to person, place, and time. He appears well-developed and well-nourished.  HENT:  Head: Normocephalic and atraumatic.  Eyes: Conjunctivae are normal.  Neck: Neck supple. No thyromegaly present.  Cardiovascular: Normal rate, regular rhythm and normal heart sounds.   No carotid Bruit  Pulmonary/Chest: Effort normal and breath sounds normal. No respiratory distress.  Abdominal: Soft.  Reducible hernia on right inguinal area.  Genitourinary:  Genitourinary Comments: Reducible small-to-moderate inguinal hernia noted  Neurological: He is alert  and oriented to person, place, and time.  Skin: Skin is warm and dry.  Psychiatric: He has a normal mood and affect. His behavior is normal.        Assessment & Plan:      1. Essential hypertension Stable. FU 6 months or sooner if needed.  2. Dementia without behavioral disturbance, unspecified dementia type Increase Aricept from 5 mg to 10 mg as below. Have discussed with both sons and they are aware this will be progressive. Consider adding Namenda on next visit.More than 50% of this visit is spent in counseling regarding the natural progression of disease. - donepezil (ARICEPT) 10 MG tablet; Take 1 tablet (10 mg total) by mouth at bedtime.  Dispense: 90 tablet; Refill: 3  3. Unilateral inguinal hernia without obstruction or gangrene, recurrence not specified New problem. Refer as below. Advised son to weigh benefits vs risks of surgery, as pt is 82 YO. Can cancel referral if pt and son prefer. - Ambulatory referral to General Surgery  4. Aortic valve stenosis, etiology of cardiac valve disease unspecified FU with cardiology as scheduled.     Patient seen and examined by Julieanne Mansonichard , MD, and note scribed by Allene DillonEmily Drozdowski, CMA.  Hasson Gaspard Wendelyn Breslow Jr, MD  [facility]

## 2016-01-12 ENCOUNTER — Encounter: Payer: Self-pay | Admitting: *Deleted

## 2016-01-19 ENCOUNTER — Ambulatory Visit: Payer: Self-pay | Admitting: General Surgery

## 2016-01-23 ENCOUNTER — Ambulatory Visit (INDEPENDENT_AMBULATORY_CARE_PROVIDER_SITE_OTHER): Payer: Medicare Other | Admitting: General Surgery

## 2016-01-23 ENCOUNTER — Encounter: Payer: Self-pay | Admitting: General Surgery

## 2016-01-23 VITALS — BP 122/66 | HR 68 | Resp 14 | Ht 66.0 in | Wt 151.0 lb

## 2016-01-23 DIAGNOSIS — K4031 Unilateral inguinal hernia, with obstruction, without gangrene, recurrent: Secondary | ICD-10-CM

## 2016-01-23 NOTE — Patient Instructions (Addendum)
Observe for any new or worsening symptoms with right inguinal hernia.  Inguinal Hernia, Adult Introduction An inguinal hernia is when fat or the intestines push through the area where the leg meets the lower belly (groin) and make a rounded lump (bulge). This condition happens over time. There are three types of inguinal hernias. These types include:  Hernias that can be pushed back into the belly (are reducible).  Hernias that cannot be pushed back into the belly (are incarcerated).  Hernias that cannot be pushed back into the belly and lose their blood supply (get strangulated). This type needs emergency surgery. Follow these instructions at home: Lifestyle  Drink enough fluid to keep your urine (pee) clear or pale yellow.  Eat plenty of fruits, vegetables, and whole grains. These have a lot of fiber. Talk with your doctor if you have questions.  Avoid lifting heavy objects.  Avoid standing for long periods of time.  Do not use tobacco products. These include cigarettes, chewing tobacco, or e-cigarettes. If you need help quitting, ask your doctor.  Try to stay at a healthy weight. General instructions  Do not try to force the hernia back in.  Watch your hernia for any changes in color or size. Let your doctor know if there are any changes.  Take over-the-counter and prescription medicines only as told by your doctor.  Keep all follow-up visits as told by your doctor. This is important. Contact a doctor if:  You have a fever.  You have new symptoms.  Your symptoms get worse. Get help right away if:  The area where the legs meets the lower belly has:  Pain that gets worse suddenly.  A bulge that gets bigger suddenly and does not go down.  A bulge that turns red or purple.  A bulge that is painful to the touch.  You are a man and your scrotum:  Suddenly feels painful.  Suddenly changes in size.  You feel sick to your stomach (nauseous) and this feeling does  not go away.  You throw up (vomit) and this keeps happening.  You feel your heart beating a lot more quickly than normal.  You cannot poop (have a bowel movement) or pass gas. This information is not intended to replace advice given to you by your health care provider. Make sure you discuss any questions you have with your health care provider. Document Released: 03/22/2006 Document Revised: 07/28/2015 Document Reviewed: 12/30/2013  2017 Elsevier

## 2016-01-23 NOTE — Progress Notes (Signed)
Patient ID: Derek GuthrieGeorge D Hancock, male   DOB: October 20, 1921, 82 y.o.   MRN: [id number]  Chief Complaint  Patient presents with  . Other    inguinal hernia    HPI Derek GuthrieGeorge D Hancock is a 82 y.o. male here today for a evaluation of a inguinal hernia. Patient states he noticed this area about a month ago. He states his right inguinal area was burning for several weeks but is no longer burning. He states the area swells but does not complain of much pain. History of bilateral inguinal hernia repairs in 1970. His son is present at visit. I have reviewed the history of present illness with the patient.  HPI  Past Medical History:  Diagnosis Date  . Elevated blood pressure     Past Surgical History:  Procedure Laterality Date  . CATARACT EXTRACTION, BILATERAL    . COLONOSCOPY    . CYST EXCISION     at the end of the spine  . HERNIA REPAIR  1970  . MELANOMA EXCISION      Family History  Problem Relation Age of Onset  . Heart disease Mother   . Heart disease Father     Social History Social History  Substance Use Topics  . Smoking status: Former Smoker    Quit date: 03/05/1968  . Smokeless tobacco: Never Used  . Alcohol use No    No Known Allergies  Current Outpatient Prescriptions  Medication Sig Dispense Refill  . acetaminophen (TYLENOL) 325 MG tablet Take by mouth.    Marland Kitchen. aspirin 81 MG tablet Take 81 mg by mouth daily.    Marland Kitchen. donepezil (ARICEPT) 10 MG tablet Take 1 tablet (10 mg total) by mouth at bedtime. 90 tablet 3  . FLUZONE HIGH-DOSE 0.5 ML SUSY     . hydrochlorothiazide (MICROZIDE) 12.5 MG capsule Take 1 capsule (12.5 mg total) by mouth daily. 90 capsule 5   No current facility-administered medications for this visit.     Review of Systems Review of Systems  Constitutional: Negative.   Respiratory: Negative.   Cardiovascular: Negative.     Blood pressure 122/66, pulse 68, resp. rate 14, height 5\' 6"  (1.676 m), weight 151 lb (68.5 kg).  Physical Exam Physical Exam   Constitutional: He is oriented to person, place, and time. He appears well-developed and well-nourished.  Eyes: Conjunctivae are normal. No scleral icterus.  Neck: Neck supple.  Cardiovascular: Normal rate, regular rhythm and normal heart sounds.   Pulmonary/Chest: Effort normal and breath sounds normal.  Abdominal: Soft. Bowel sounds are normal. He exhibits no mass. There is no tenderness. A hernia is present. Hernia confirmed positive in the right inguinal area ( right inguinal hernia visible with standing, reducible). Hernia confirmed negative in the left inguinal area.  Healed incisions in both inguinal regions  Lymphadenopathy:    He has no cervical adenopathy.  Neurological: He is alert and oriented to person, place, and time.  Skin: Skin is warm and dry.    Data Reviewed None  Assessment    Right inguinal hernia, reducible. This appears to be a direct hernia, seen best when he stands up, reduces spontaneously when lying down.     Plan    Patient is agreeable to watch right inguinal hernia for any new or worsening symptoms.  Hernia precautions and incarceration were discussed with the patient. If he develops symptoms of an incarcerated hernia, he was encouraged to seek prompt medical attention.      This information has been scribed by  Ples SpecterJessica Qualls CMA.  Jenea Dake G 01/31/2016, 8:02 AM

## 2016-01-31 ENCOUNTER — Encounter: Payer: Self-pay | Admitting: General Surgery

## 2016-02-01 DIAGNOSIS — L821 Other seborrheic keratosis: Secondary | ICD-10-CM | POA: Diagnosis not present

## 2016-02-01 DIAGNOSIS — Z85828 Personal history of other malignant neoplasm of skin: Secondary | ICD-10-CM | POA: Diagnosis not present

## 2016-02-01 DIAGNOSIS — L570 Actinic keratosis: Secondary | ICD-10-CM | POA: Diagnosis not present

## 2016-02-01 DIAGNOSIS — L219 Seborrheic dermatitis, unspecified: Secondary | ICD-10-CM | POA: Diagnosis not present

## 2016-02-02 ENCOUNTER — Encounter: Payer: Self-pay | Admitting: Family Medicine

## 2016-03-01 DIAGNOSIS — I2510 Atherosclerotic heart disease of native coronary artery without angina pectoris: Secondary | ICD-10-CM | POA: Diagnosis not present

## 2016-03-01 DIAGNOSIS — R001 Bradycardia, unspecified: Secondary | ICD-10-CM | POA: Diagnosis not present

## 2016-03-01 DIAGNOSIS — I10 Essential (primary) hypertension: Secondary | ICD-10-CM | POA: Diagnosis not present

## 2016-03-01 DIAGNOSIS — I6523 Occlusion and stenosis of bilateral carotid arteries: Secondary | ICD-10-CM | POA: Insufficient documentation

## 2016-03-07 DIAGNOSIS — R001 Bradycardia, unspecified: Secondary | ICD-10-CM | POA: Diagnosis not present

## 2016-03-26 DIAGNOSIS — I471 Supraventricular tachycardia, unspecified: Secondary | ICD-10-CM | POA: Insufficient documentation

## 2016-03-26 DIAGNOSIS — M79675 Pain in left toe(s): Secondary | ICD-10-CM | POA: Diagnosis not present

## 2016-03-26 DIAGNOSIS — I2510 Atherosclerotic heart disease of native coronary artery without angina pectoris: Secondary | ICD-10-CM | POA: Diagnosis not present

## 2016-03-26 DIAGNOSIS — R600 Localized edema: Secondary | ICD-10-CM | POA: Diagnosis not present

## 2016-03-26 DIAGNOSIS — I350 Nonrheumatic aortic (valve) stenosis: Secondary | ICD-10-CM | POA: Diagnosis not present

## 2016-03-26 DIAGNOSIS — B351 Tinea unguium: Secondary | ICD-10-CM | POA: Diagnosis not present

## 2016-03-26 DIAGNOSIS — M79674 Pain in right toe(s): Secondary | ICD-10-CM | POA: Diagnosis not present

## 2016-07-05 DIAGNOSIS — B351 Tinea unguium: Secondary | ICD-10-CM | POA: Diagnosis not present

## 2016-07-05 DIAGNOSIS — M79674 Pain in right toe(s): Secondary | ICD-10-CM | POA: Diagnosis not present

## 2016-07-05 DIAGNOSIS — L97521 Non-pressure chronic ulcer of other part of left foot limited to breakdown of skin: Secondary | ICD-10-CM | POA: Diagnosis not present

## 2016-07-05 DIAGNOSIS — M79675 Pain in left toe(s): Secondary | ICD-10-CM | POA: Diagnosis not present

## 2016-07-11 ENCOUNTER — Ambulatory Visit (INDEPENDENT_AMBULATORY_CARE_PROVIDER_SITE_OTHER): Payer: Medicare Other | Admitting: Family Medicine

## 2016-07-11 VITALS — BP 148/56 | HR 72 | Temp 98.3°F | Resp 12 | Wt 156.0 lb

## 2016-07-11 DIAGNOSIS — K219 Gastro-esophageal reflux disease without esophagitis: Secondary | ICD-10-CM | POA: Diagnosis not present

## 2016-07-11 DIAGNOSIS — F0390 Unspecified dementia without behavioral disturbance: Secondary | ICD-10-CM

## 2016-07-11 DIAGNOSIS — I10 Essential (primary) hypertension: Secondary | ICD-10-CM | POA: Diagnosis not present

## 2016-07-11 MED ORDER — OMEPRAZOLE 20 MG PO CPDR: 20.0000 mg | DELAYED_RELEASE_CAPSULE | Freq: Every day | ORAL | 3 refills | Status: DC

## 2016-07-11 NOTE — Progress Notes (Signed)
Derek Hancock  MRN: [id number] DOB: 30-May-1921  Subjective:  HPI  Patient is here for 6 months follow up. At that time for memory Aricept was increased to 10 mg daily. 6CIT score was 16. About 2 weeks after that patient stopped due to having GI issues. Memory is about the same per patient's sons that are present today. 6CIT Screen 01/11/2016  What Year? 4 points  What month? 0 points  What time? 0 points  Count back from 20 2 points  Months in reverse 4 points  Repeat phrase 6 points  Total Score 16    Wt Readings from Last 3 Encounters:  07/11/16 156 lb (70.8 kg)  01/23/16 151 lb (68.5 kg)  01/11/16 160 lb 4 oz (72.7 kg)  patient has not been checking his b/p. Denies cardiac symptoms. BP Readings from Last 3 Encounters:  07/11/16 (!) 148/56  01/23/16 122/66  01/11/16 (!) 156/62   Since last visit patient has seen Dr Kowalski-cardiologist for routine follow up and also seen Dr Alberteen Spindle. Patient Active Problem List   Diagnosis Date Noted  . Dementia 01/11/2016  . Hypertension 09/15/2014  . History of tobacco use 09/15/2014  . Cardiac murmur 09/15/2014    Past Medical History:  Diagnosis Date  . Elevated blood pressure     Social History   Social History  . Marital status: Widowed    Spouse name: N/A  . Number of children: N/A  . Years of education: N/A   Occupational History  . Not on file.   Social History Main Topics  . Smoking status: Former Smoker    Quit date: 03/05/1968  . Smokeless tobacco: Never Used  . Alcohol use No  . Drug use: No  . Sexual activity: Not on file   Other Topics Concern  . Not on file   Social History Narrative  . No narrative on file    Outpatient Encounter Prescriptions as of 07/11/2016  Medication Sig Note  . acetaminophen (TYLENOL) 325 MG tablet Take by mouth. 09/15/2014: Medication taken as needed.  Received from: [facility]  . aspirin 81 MG tablet Take 81 mg by mouth daily.   . hydrochlorothiazide  (MICROZIDE) 12.5 MG capsule Take 1 capsule (12.5 mg total) by mouth daily.   Marland Kitchen FLUZONE HIGH-DOSE 0.5 ML SUSY  01/11/2016: Received from: External Pharmacy  . [DISCONTINUED] donepezil (ARICEPT) 10 MG tablet Take 1 tablet (10 mg total) by mouth at bedtime.    No facility-administered encounter medications on file as of 07/11/2016.     No Known Allergies  Review of Systems  Constitutional: Negative.   Eyes: Negative.   Respiratory: Negative.   Cardiovascular: Negative.   Gastrointestinal: Negative.   Musculoskeletal: Negative.   Neurological: Negative.   Endo/Heme/Allergies: Negative.   Psychiatric/Behavioral: Positive for memory loss.    Objective:  BP (!) 148/56   Pulse 72   Temp 98.3 F (36.8 C)   Resp 12   Wt 156 lb (70.8 kg)   BMI 25.18 kg/m   Physical Exam  Constitutional: He is well-developed, well-nourished, and in no distress.  HENT:  Head: Normocephalic and atraumatic.  Eyes: Conjunctivae are normal. Pupils are equal, round, and reactive to light.  Neck: Normal range of motion. Neck supple.  Cardiovascular: Normal rate, regular rhythm, normal heart sounds and intact distal pulses.   No murmur heard. Pulmonary/Chest: Effort normal and breath sounds normal. No respiratory distress. He has no wheezes.  Musculoskeletal: He exhibits edema (1+).  Assessment and Plan :  1. Essential hypertension Stable.  2. Dementia without behavioral disturbance, unspecified dementia type/Alzheimers  RTC 6 months. Pt not driving. 3. Gastroesophageal reflux disease, esophagitis presence not specified Try omeprazole prn.  HPI, Exam and A&P transcribed by Samara DeistAnastasiya Aleksandrova, RMA under direction and in the presence of Julieanne Mansonichard Gilbert, MD. I have done the exam and reviewed the chart and it is accurate to the best of my knowledge. [employer]ist[employer]  technology has been used and  any errors in dictation or transcription are unintentional. Julieanne Mansonichard Gilbert M.D. [facility]

## 2016-07-21 IMAGING — US US CAROTID DUPLEX BILAT
1 series · 13 of 24 positions shown · non-contrast
Comparison: Head CT - 04/17/2014

CLINICAL DATA: Left-sided carotid bruit.  History of hypertension.

EXAM:
BILATERAL CAROTID DUPLEX ULTRASOUND
TECHNIQUE: Gray scale imaging, color Doppler and duplex ultrasound were
performed of bilateral carotid and vertebral arteries in the neck.

[Series 1: us carotid duplex bilat · 0.05mm/px · 13 of 64 slices shown]
[im 1/64]
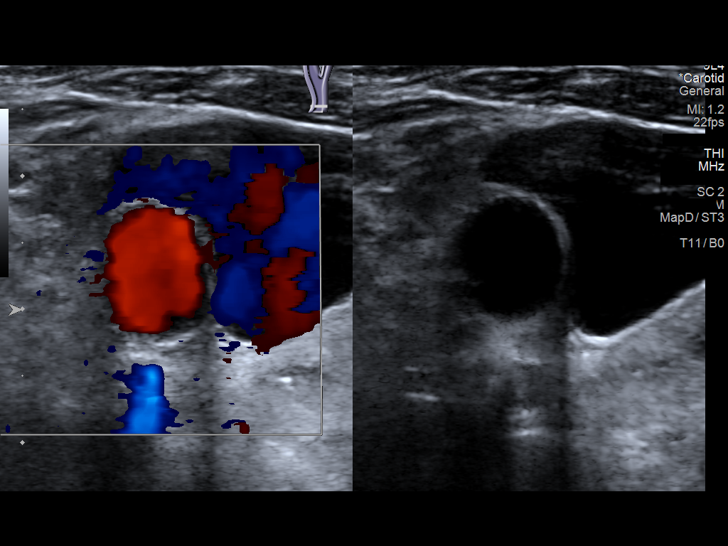
[im 6/64]
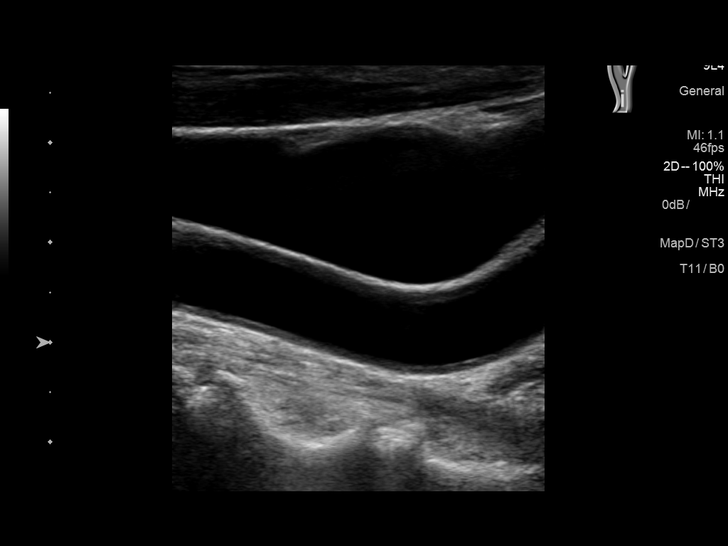
[im 11/64]
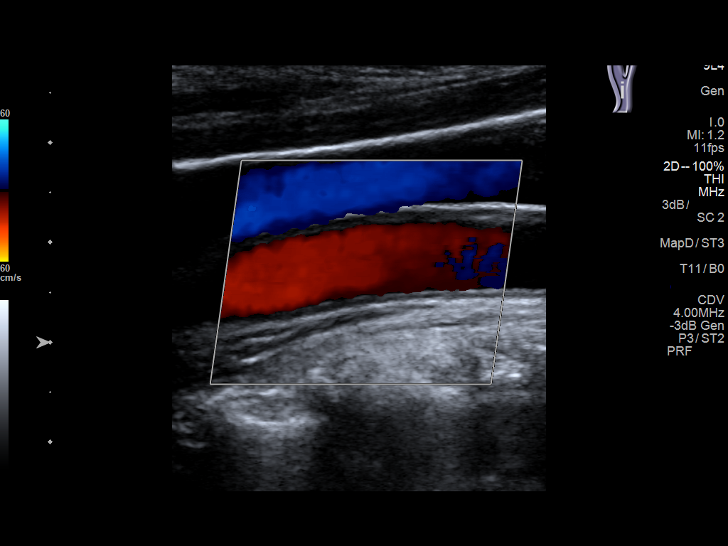
[im 17/64]
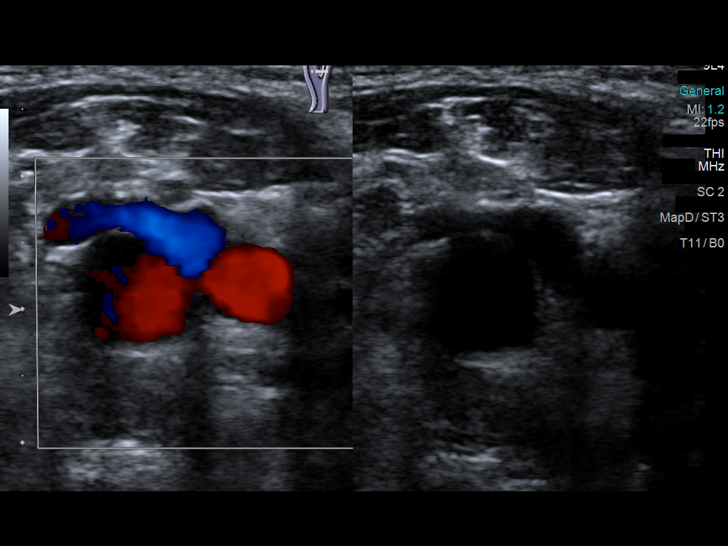
[im 22/64]
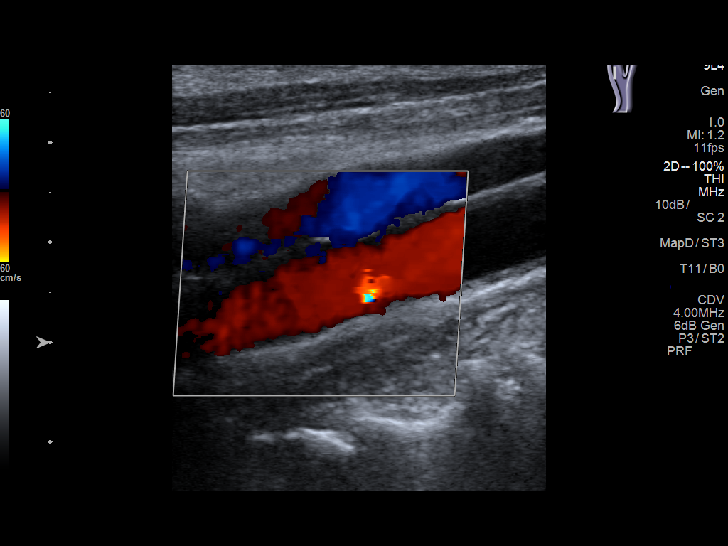
[im 28/64]
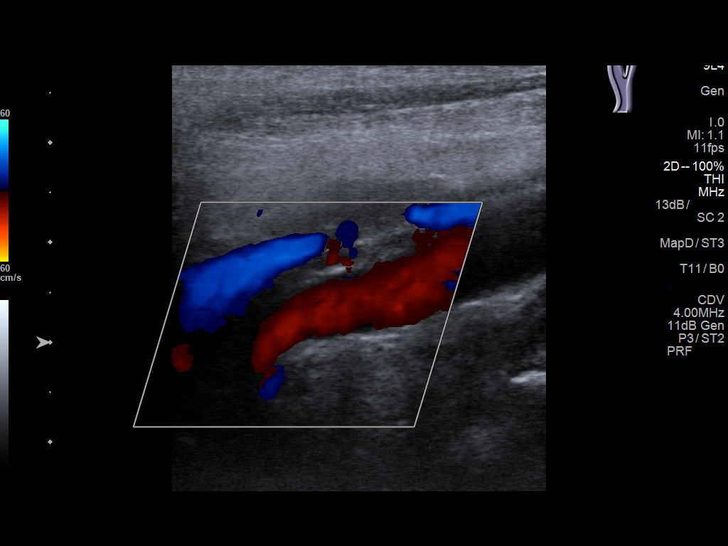
[im 33/64]
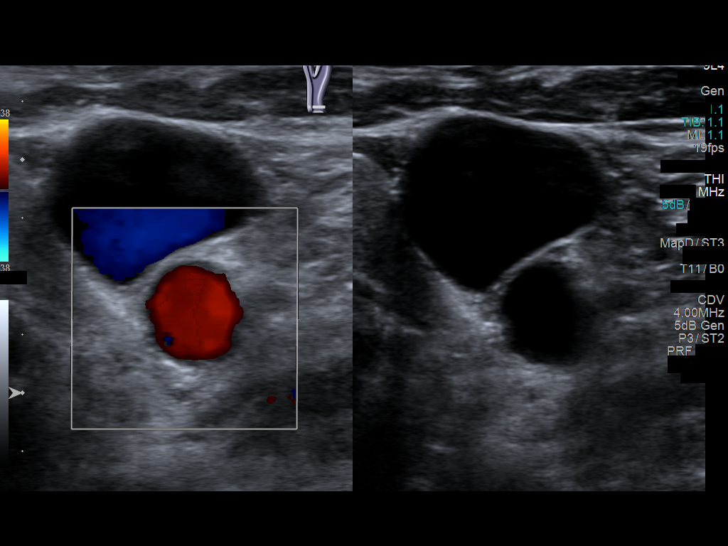
[im 36/64]
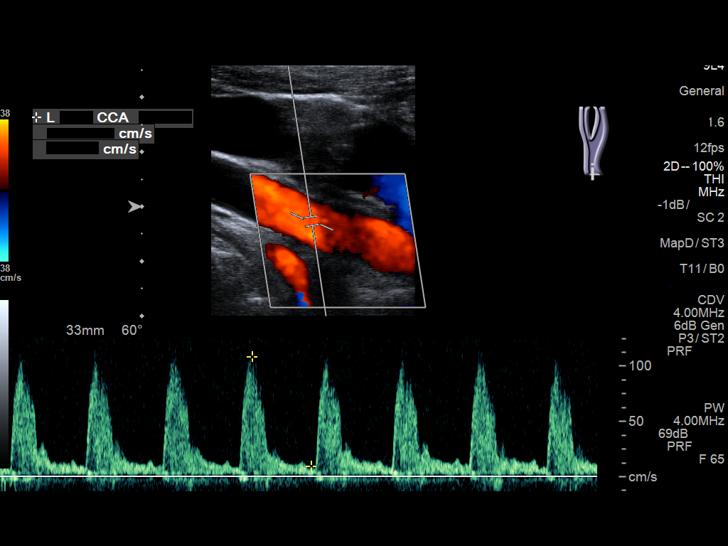
[im 42/64]
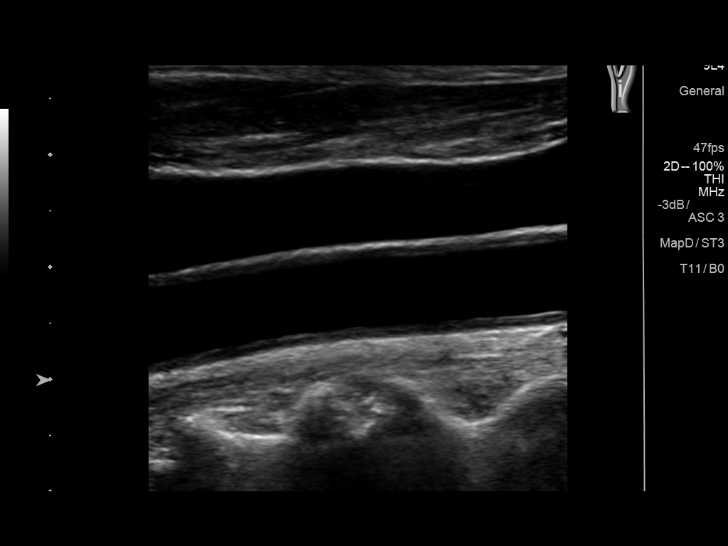
[im 47/64]
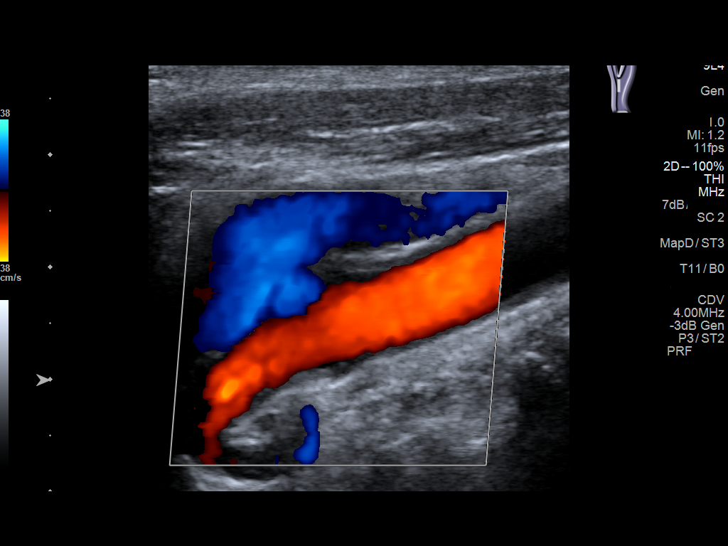
[im 53/64]
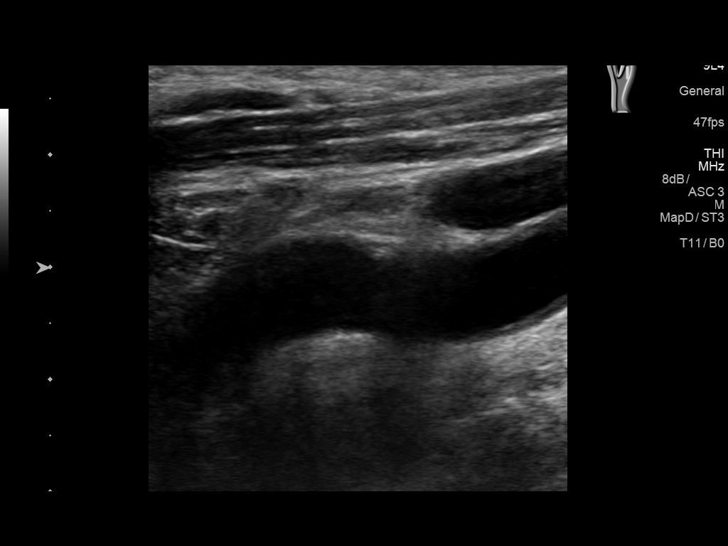
[im 58/64]
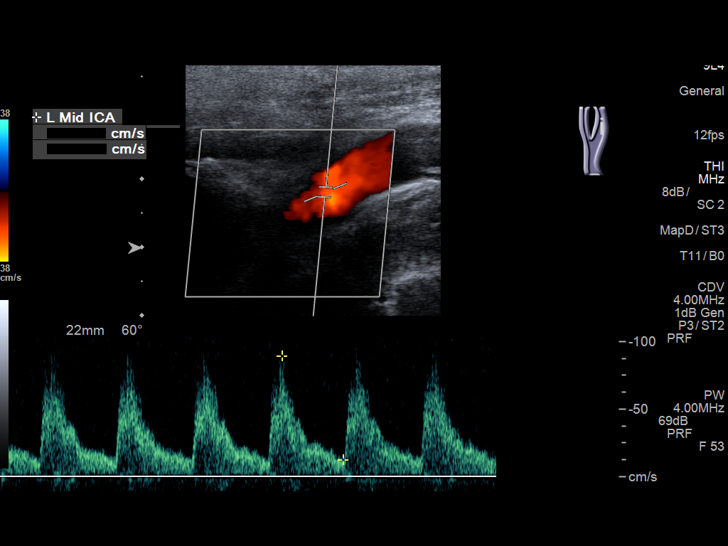
[im 64/64]
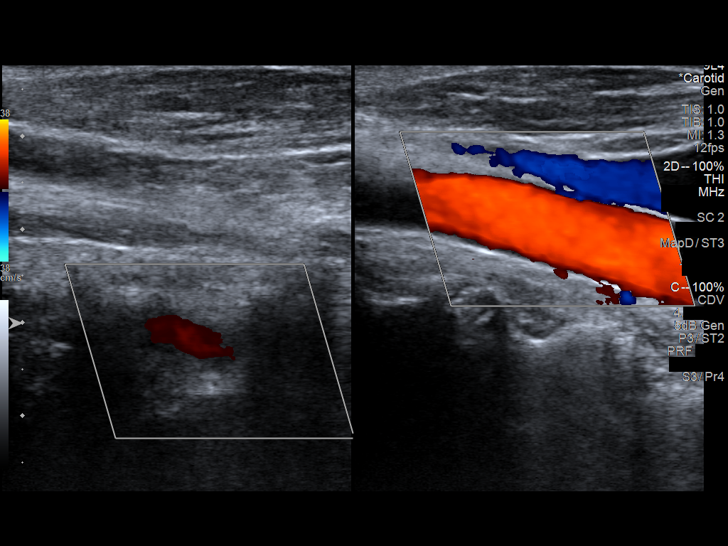

[13 of 24 positions shown; findings below may reference images not displayed]

FINDINGS: Criteria: Quantification of carotid stenosis is based on velocity
parameters that correlate the residual internal carotid diameter
with NASCET-based stenosis levels, using the diameter of the distal
internal carotid lumen as the denominator for stenosis measurement.

The following velocity measurements were obtained:

RIGHT

ICA:  62/15 cm/sec

CCA:  83/9 cm/sec

SYSTOLIC ICA/CCA RATIO:

DIASTOLIC ICA/CCA RATIO:

ECA:  81 cm/sec

LEFT

ICA:  95/16 cm/sec

CCA:  91/14 cm/sec

SYSTOLIC ICA/CCA RATIO:

DIASTOLIC ICA/CCA RATIO:

ECA:  91 cm/sec

RIGHT CAROTID ARTERY: There is mild tortuosity of the right common
carotid artery (representative image 4). There is a minimal amount
of eccentric mixed echogenic plaque within the right carotid bulb
(images 14 and 15), not resulting in elevated peak systolic
velocities within the interrogated course the right internal carotid
artery to suggest a hemodynamically significant stenosis.

RIGHT VERTEBRAL ARTERY:  Antegrade Flow

LEFT CAROTID ARTERY: There is a minimal amount of eccentric mixed
echogenic plaque within the left carotid bulb (image 47), extending
to involve the origin and proximal aspects the left internal carotid
artery (image 54), not resulting in elevated peak systolic
velocities within the interrogated course the left internal carotid
artery to suggest a hemodynamically significant stenosis.

LEFT VERTEBRAL ARTERY:  Antegrade Flow
IMPRESSION: Minimal amount of bilateral atherosclerotic plaque, right
subjectively greater than left, not resulting in a hemodynamically
significant stenosis within either internal carotid artery.

## 2016-09-11 ENCOUNTER — Ambulatory Visit (INDEPENDENT_AMBULATORY_CARE_PROVIDER_SITE_OTHER): Payer: Medicare Other | Admitting: Family Medicine

## 2016-09-11 ENCOUNTER — Encounter: Payer: Self-pay | Admitting: Family Medicine

## 2016-09-11 VITALS — BP 128/60 | HR 74 | Temp 97.3°F | Resp 16 | Wt 159.0 lb

## 2016-09-11 DIAGNOSIS — I10 Essential (primary) hypertension: Secondary | ICD-10-CM

## 2016-09-11 DIAGNOSIS — F0390 Unspecified dementia without behavioral disturbance: Secondary | ICD-10-CM

## 2016-09-11 DIAGNOSIS — K219 Gastro-esophageal reflux disease without esophagitis: Secondary | ICD-10-CM | POA: Diagnosis not present

## 2016-09-11 DIAGNOSIS — Z111 Encounter for screening for respiratory tuberculosis: Secondary | ICD-10-CM

## 2016-09-11 NOTE — Progress Notes (Signed)
Subjective:  HPI Pt is here today with his sons to discuss possibility of being put ina nursing home. He does not know this. His son have POA. They are requesting a FL2 for being filled out today.     Prior to Admission medications   Medication Sig Start Date End Date Taking? Authorizing Provider  acetaminophen (TYLENOL) 325 MG tablet Take by mouth.    [provider]  aspirin 81 MG tablet Take 81 mg by mouth daily.    [provider]  FLUZONE HIGH-DOSE 0.5 ML SUSY  11/29/15   [provider]  hydrochlorothiazide (MICROZIDE) 12.5 MG capsule Take 1 capsule (12.5 mg total) by mouth daily. 08/11/15   Maple Hudson., MD  omeprazole (PRILOSEC) 20 MG capsule Take 1 capsule (20 mg total) by mouth daily. 07/11/16   Maple Hudson., MD    Patient Active Problem List   Diagnosis Date Noted  . PSVT (paroxysmal supraventricular tachycardia) (HCC) 03/26/2016  . Bilateral carotid artery stenosis 03/01/2016  . Dementia 01/11/2016  . Hypertension 09/15/2014  . History of tobacco use 09/15/2014  . Cardiac murmur 09/15/2014    Past Medical History:  Diagnosis Date  . Elevated blood pressure     Social History   Social History  . Marital status: Widowed    Spouse name: N/A  . Number of children: N/A  . Years of education: N/A   Occupational History  . Not on file.   Social History Main Topics  . Smoking status: Former Smoker    Quit date: 03/05/1968  . Smokeless tobacco: Never Used  . Alcohol use No  . Drug use: No  . Sexual activity: Not on file   Other Topics Concern  . Not on file   Social History Narrative  . No narrative on file    No Known Allergies  Review of Systems  Constitutional: Negative.   HENT: Negative.   Eyes: Negative.   Respiratory: Negative.   Cardiovascular: Negative.   Gastrointestinal: Negative.   Genitourinary: Negative.   Musculoskeletal: Negative.   Skin: Negative.   Neurological: Negative.     Endo/Heme/Allergies: Negative.   Psychiatric/Behavioral: Positive for memory loss.    Immunization History  Administered Date(s) Administered  . Influenza-Unspecified 11/30/2015  . Td 02/01/2014    Objective:  BP 128/60 (BP Location: Left Arm, Patient Position: Sitting, Cuff Size: Normal)   Pulse 74   Temp (!) 97.3 F (36.3 C) (Oral)   Resp 16   Wt 159 lb (72.1 kg)   BMI 25.66 kg/m   Physical Exam  Constitutional: He is well-developed, well-nourished, and in no distress.  HENT:  Head: Normocephalic and atraumatic.  Eyes: Conjunctivae are normal. No scleral icterus.  Neck: No thyromegaly present.  Cardiovascular: Normal rate and regular rhythm.   Pulmonary/Chest: Effort normal and breath sounds normal.  Abdominal: Soft.  Neurological: He is alert.  Skin: Skin is warm and dry.  Psychiatric: Mood and affect normal.    Lab Results  Component Value Date   WBC 9.7 07/19/2015   HGB 12.1 (L) 07/19/2015   HCT 36.8 (L) 07/19/2015   PLT 205 07/19/2015   GLUCOSE 89 07/19/2015   CHOL 168 07/19/2015   TRIG 83 07/19/2015   HDL 74 07/19/2015   LDLCALC 77 07/19/2015   TSH 2.270 07/19/2015    CMP     Component Value Date/Time   NA 140 07/19/2015 1117   K 4.2 07/19/2015 1117   CL 98 07/19/2015  1117   CO2 27 07/19/2015 1117   GLUCOSE 89 07/19/2015 1117   BUN 25 07/19/2015 1117   CREATININE 1.20 07/19/2015 1117   CALCIUM 9.1 07/19/2015 1117   PROT 6.4 07/19/2015 1117   ALBUMIN 4.3 07/19/2015 1117   AST 13 07/19/2015 1117   ALT 8 07/19/2015 1117   ALKPHOS 78 07/19/2015 1117   BILITOT 0.4 07/19/2015 1117   GFRNONAA 53 (L) 07/19/2015 1117   GFRAA 61 07/19/2015 1117    Assessment and Plan :  Alzheimers Dementia No behavioral disturbance. FL2 form filled out for assited living. MMSE 11/30 today--delining. HTN GERD  I have done the exam and reviewed the above chart and it is accurate to the best of my knowledge. DentistDragon  technology has been used in this note in any air  is in the dictation or transcription are unintentional.  Julieanne Mansonichard Gilbert MD [facility] 09/11/2016 2:59 PM

## 2016-09-14 LAB — COMPREHENSIVE METABOLIC PANEL
A/G RATIO: 1.8 (ref 1.2–2.2)
ALK PHOS: 109 IU/L (ref 39–117)
ALT: 8 IU/L (ref 0–44)
AST: 12 IU/L (ref 0–40)
Albumin: 4.1 g/dL (ref 3.2–4.6)
BILIRUBIN TOTAL: 0.3 mg/dL (ref 0.0–1.2)
BUN/Creatinine Ratio: 16 (ref 10–24)
BUN: 22 mg/dL (ref 10–36)
CHLORIDE: 101 mmol/L (ref 96–106)
CO2: 25 mmol/L (ref 20–29)
Calcium: 9 mg/dL (ref 8.6–10.2)
Creatinine, Ser: 1.36 mg/dL — ABNORMAL HIGH (ref 0.76–1.27)
GFR calc Af Amer: 52 mL/min/{1.73_m2} — ABNORMAL LOW (ref >59)
GFR calc non Af Amer: 45 mL/min/{1.73_m2} — ABNORMAL LOW (ref >59)
Globulin, Total: 2.3 g/dL (ref 1.5–4.5)
Glucose: 115 mg/dL — ABNORMAL HIGH (ref 65–99)
POTASSIUM: 4.2 mmol/L (ref 3.5–5.2)
Sodium: 141 mmol/L (ref 134–144)
Total Protein: 6.4 g/dL (ref 6.0–8.5)

## 2016-09-14 LAB — CBC WITH DIFFERENTIAL/PLATELET
BASOS ABS: 0 10*3/uL (ref 0.0–0.2)
Basos: 0 %
EOS (ABSOLUTE): 0.1 10*3/uL (ref 0.0–0.4)
Eos: 1 %
Hematocrit: 31.5 % — ABNORMAL LOW (ref 37.5–51.0)
Hemoglobin: 10.1 g/dL — ABNORMAL LOW (ref 13.0–17.7)
IMMATURE GRANS (ABS): 0 10*3/uL (ref 0.0–0.1)
Immature Granulocytes: 0 %
LYMPHS ABS: 1.7 10*3/uL (ref 0.7–3.1)
LYMPHS: 25 %
MCH: 30 pg (ref 26.6–33.0)
MCHC: 32.1 g/dL (ref 31.5–35.7)
MCV: 94 fL (ref 79–97)
Monocytes Absolute: 0.5 10*3/uL (ref 0.1–0.9)
Monocytes: 7 %
NEUTROS ABS: 4.6 10*3/uL (ref 1.4–7.0)
Neutrophils: 67 %
PLATELETS: 243 10*3/uL (ref 150–379)
RBC: 3.37 x10E6/uL — ABNORMAL LOW (ref 4.14–5.80)
RDW: 14.5 % (ref 12.3–15.4)
WBC: 6.9 10*3/uL (ref 3.4–10.8)

## 2016-09-14 LAB — TSH: TSH: 2.52 u[IU]/mL (ref 0.450–4.500)

## 2016-09-14 LAB — QUANTIFERON IN TUBE
QFT TB AG MINUS NIL VALUE: <0 IU/mL
QUANTIFERON MITOGEN VALUE: 7.05 IU/mL
QUANTIFERON NIL VALUE: 0.07 [IU]/mL
QUANTIFERON TB AG VALUE: 0.05 [IU]/mL
QUANTIFERON TB GOLD: NEGATIVE

## 2016-09-14 LAB — QUANTIFERON TB GOLD ASSAY (BLOOD)

## 2016-09-24 ENCOUNTER — Encounter: Payer: Self-pay | Admitting: Family Medicine

## 2016-09-25 DIAGNOSIS — K4090 Unilateral inguinal hernia, without obstruction or gangrene, not specified as recurrent: Secondary | ICD-10-CM | POA: Diagnosis not present

## 2016-09-25 DIAGNOSIS — I352 Nonrheumatic aortic (valve) stenosis with insufficiency: Secondary | ICD-10-CM | POA: Diagnosis not present

## 2016-09-25 DIAGNOSIS — I10 Essential (primary) hypertension: Secondary | ICD-10-CM | POA: Diagnosis not present

## 2016-09-28 DIAGNOSIS — Z79899 Other long term (current) drug therapy: Secondary | ICD-10-CM | POA: Diagnosis not present

## 2016-10-11 DIAGNOSIS — R739 Hyperglycemia, unspecified: Secondary | ICD-10-CM | POA: Diagnosis not present

## 2016-10-11 DIAGNOSIS — I10 Essential (primary) hypertension: Secondary | ICD-10-CM | POA: Diagnosis not present

## 2016-10-12 DIAGNOSIS — Z79899 Other long term (current) drug therapy: Secondary | ICD-10-CM | POA: Diagnosis not present

## 2016-10-12 DIAGNOSIS — D649 Anemia, unspecified: Secondary | ICD-10-CM | POA: Diagnosis not present

## 2016-10-16 DIAGNOSIS — G301 Alzheimer's disease with late onset: Secondary | ICD-10-CM | POA: Diagnosis not present

## 2016-10-16 DIAGNOSIS — Z23 Encounter for immunization: Secondary | ICD-10-CM | POA: Diagnosis not present

## 2016-10-16 DIAGNOSIS — R739 Hyperglycemia, unspecified: Secondary | ICD-10-CM | POA: Diagnosis not present

## 2016-10-16 DIAGNOSIS — I10 Essential (primary) hypertension: Secondary | ICD-10-CM | POA: Diagnosis not present

## 2016-10-18 DIAGNOSIS — R739 Hyperglycemia, unspecified: Secondary | ICD-10-CM | POA: Diagnosis not present

## 2016-10-18 DIAGNOSIS — R600 Localized edema: Secondary | ICD-10-CM | POA: Diagnosis not present

## 2016-10-18 DIAGNOSIS — K219 Gastro-esophageal reflux disease without esophagitis: Secondary | ICD-10-CM | POA: Diagnosis not present

## 2016-10-18 DIAGNOSIS — Z79899 Other long term (current) drug therapy: Secondary | ICD-10-CM | POA: Diagnosis not present

## 2016-10-18 DIAGNOSIS — D508 Other iron deficiency anemias: Secondary | ICD-10-CM | POA: Diagnosis not present

## 2016-10-25 DIAGNOSIS — I10 Essential (primary) hypertension: Secondary | ICD-10-CM | POA: Diagnosis not present

## 2016-10-25 DIAGNOSIS — Z79899 Other long term (current) drug therapy: Secondary | ICD-10-CM | POA: Diagnosis not present

## 2016-10-25 DIAGNOSIS — E876 Hypokalemia: Secondary | ICD-10-CM | POA: Diagnosis not present

## 2016-10-25 DIAGNOSIS — R600 Localized edema: Secondary | ICD-10-CM | POA: Diagnosis not present

## 2016-10-25 DIAGNOSIS — G308 Other Alzheimer's disease: Secondary | ICD-10-CM | POA: Diagnosis not present

## 2016-10-26 DIAGNOSIS — R600 Localized edema: Secondary | ICD-10-CM | POA: Diagnosis not present

## 2016-10-27 DIAGNOSIS — E876 Hypokalemia: Secondary | ICD-10-CM | POA: Diagnosis not present

## 2016-10-27 DIAGNOSIS — I10 Essential (primary) hypertension: Secondary | ICD-10-CM | POA: Diagnosis not present

## 2016-10-27 DIAGNOSIS — G308 Other Alzheimer's disease: Secondary | ICD-10-CM | POA: Diagnosis not present

## 2016-10-27 DIAGNOSIS — K219 Gastro-esophageal reflux disease without esophagitis: Secondary | ICD-10-CM | POA: Diagnosis not present

## 2016-10-29 DIAGNOSIS — Z79899 Other long term (current) drug therapy: Secondary | ICD-10-CM | POA: Diagnosis not present

## 2016-11-01 DIAGNOSIS — R739 Hyperglycemia, unspecified: Secondary | ICD-10-CM | POA: Diagnosis not present

## 2016-11-01 DIAGNOSIS — D509 Iron deficiency anemia, unspecified: Secondary | ICD-10-CM | POA: Diagnosis not present

## 2016-11-01 DIAGNOSIS — Z79899 Other long term (current) drug therapy: Secondary | ICD-10-CM | POA: Diagnosis not present

## 2016-11-01 DIAGNOSIS — R600 Localized edema: Secondary | ICD-10-CM | POA: Diagnosis not present

## 2016-11-01 DIAGNOSIS — E876 Hypokalemia: Secondary | ICD-10-CM | POA: Diagnosis not present

## 2016-11-06 DIAGNOSIS — K219 Gastro-esophageal reflux disease without esophagitis: Secondary | ICD-10-CM | POA: Diagnosis not present

## 2016-11-06 DIAGNOSIS — R600 Localized edema: Secondary | ICD-10-CM | POA: Diagnosis not present

## 2016-11-06 DIAGNOSIS — D509 Iron deficiency anemia, unspecified: Secondary | ICD-10-CM | POA: Diagnosis not present

## 2016-11-06 DIAGNOSIS — I10 Essential (primary) hypertension: Secondary | ICD-10-CM | POA: Diagnosis not present

## 2016-11-09 DIAGNOSIS — Z79899 Other long term (current) drug therapy: Secondary | ICD-10-CM | POA: Diagnosis not present

## 2016-11-14 DIAGNOSIS — Z79899 Other long term (current) drug therapy: Secondary | ICD-10-CM | POA: Diagnosis not present

## 2016-11-14 DIAGNOSIS — R3915 Urgency of urination: Secondary | ICD-10-CM | POA: Diagnosis not present

## 2016-11-15 DIAGNOSIS — G4751 Confusional arousals: Secondary | ICD-10-CM | POA: Diagnosis not present

## 2016-11-15 DIAGNOSIS — D508 Other iron deficiency anemias: Secondary | ICD-10-CM | POA: Diagnosis not present

## 2016-11-15 DIAGNOSIS — R600 Localized edema: Secondary | ICD-10-CM | POA: Diagnosis not present

## 2016-11-15 DIAGNOSIS — G308 Other Alzheimer's disease: Secondary | ICD-10-CM | POA: Diagnosis not present

## 2016-11-20 DIAGNOSIS — D509 Iron deficiency anemia, unspecified: Secondary | ICD-10-CM | POA: Diagnosis not present

## 2016-11-20 DIAGNOSIS — K219 Gastro-esophageal reflux disease without esophagitis: Secondary | ICD-10-CM | POA: Diagnosis not present

## 2016-11-20 DIAGNOSIS — R600 Localized edema: Secondary | ICD-10-CM | POA: Diagnosis not present

## 2016-11-20 DIAGNOSIS — I10 Essential (primary) hypertension: Secondary | ICD-10-CM | POA: Diagnosis not present

## 2016-11-22 DIAGNOSIS — I10 Essential (primary) hypertension: Secondary | ICD-10-CM | POA: Diagnosis not present

## 2016-11-22 DIAGNOSIS — R54 Age-related physical debility: Secondary | ICD-10-CM | POA: Diagnosis not present

## 2016-11-22 DIAGNOSIS — E876 Hypokalemia: Secondary | ICD-10-CM | POA: Diagnosis not present

## 2016-11-27 DIAGNOSIS — M6281 Muscle weakness (generalized): Secondary | ICD-10-CM | POA: Diagnosis not present

## 2016-11-27 DIAGNOSIS — I10 Essential (primary) hypertension: Secondary | ICD-10-CM | POA: Diagnosis not present

## 2016-11-27 DIAGNOSIS — G309 Alzheimer's disease, unspecified: Secondary | ICD-10-CM | POA: Diagnosis not present

## 2016-11-28 DIAGNOSIS — M6281 Muscle weakness (generalized): Secondary | ICD-10-CM | POA: Diagnosis not present

## 2016-11-28 DIAGNOSIS — G309 Alzheimer's disease, unspecified: Secondary | ICD-10-CM | POA: Diagnosis not present

## 2016-11-29 DIAGNOSIS — I10 Essential (primary) hypertension: Secondary | ICD-10-CM | POA: Diagnosis not present

## 2016-11-29 DIAGNOSIS — D508 Other iron deficiency anemias: Secondary | ICD-10-CM | POA: Diagnosis not present

## 2016-11-29 DIAGNOSIS — R54 Age-related physical debility: Secondary | ICD-10-CM | POA: Diagnosis not present

## 2016-11-29 DIAGNOSIS — E876 Hypokalemia: Secondary | ICD-10-CM | POA: Diagnosis not present

## 2016-12-03 DIAGNOSIS — I10 Essential (primary) hypertension: Secondary | ICD-10-CM | POA: Diagnosis not present

## 2016-12-03 DIAGNOSIS — Z9181 History of falling: Secondary | ICD-10-CM | POA: Diagnosis not present

## 2016-12-03 DIAGNOSIS — Z7982 Long term (current) use of aspirin: Secondary | ICD-10-CM | POA: Diagnosis not present

## 2016-12-03 DIAGNOSIS — G309 Alzheimer's disease, unspecified: Secondary | ICD-10-CM | POA: Diagnosis not present

## 2016-12-03 DIAGNOSIS — R269 Unspecified abnormalities of gait and mobility: Secondary | ICD-10-CM | POA: Diagnosis not present

## 2016-12-03 DIAGNOSIS — K219 Gastro-esophageal reflux disease without esophagitis: Secondary | ICD-10-CM | POA: Diagnosis not present

## 2016-12-04 DIAGNOSIS — G309 Alzheimer's disease, unspecified: Secondary | ICD-10-CM | POA: Diagnosis not present

## 2016-12-04 DIAGNOSIS — I10 Essential (primary) hypertension: Secondary | ICD-10-CM | POA: Diagnosis not present

## 2016-12-04 DIAGNOSIS — Z7982 Long term (current) use of aspirin: Secondary | ICD-10-CM | POA: Diagnosis not present

## 2016-12-04 DIAGNOSIS — R269 Unspecified abnormalities of gait and mobility: Secondary | ICD-10-CM | POA: Diagnosis not present

## 2016-12-04 DIAGNOSIS — Z9181 History of falling: Secondary | ICD-10-CM | POA: Diagnosis not present

## 2016-12-04 DIAGNOSIS — K219 Gastro-esophageal reflux disease without esophagitis: Secondary | ICD-10-CM | POA: Diagnosis not present

## 2016-12-05 DIAGNOSIS — I10 Essential (primary) hypertension: Secondary | ICD-10-CM | POA: Diagnosis not present

## 2016-12-05 DIAGNOSIS — Z7982 Long term (current) use of aspirin: Secondary | ICD-10-CM | POA: Diagnosis not present

## 2016-12-05 DIAGNOSIS — G309 Alzheimer's disease, unspecified: Secondary | ICD-10-CM | POA: Diagnosis not present

## 2016-12-05 DIAGNOSIS — R269 Unspecified abnormalities of gait and mobility: Secondary | ICD-10-CM | POA: Diagnosis not present

## 2016-12-05 DIAGNOSIS — K219 Gastro-esophageal reflux disease without esophagitis: Secondary | ICD-10-CM | POA: Diagnosis not present

## 2016-12-05 DIAGNOSIS — Z9181 History of falling: Secondary | ICD-10-CM | POA: Diagnosis not present

## 2016-12-11 DIAGNOSIS — K219 Gastro-esophageal reflux disease without esophagitis: Secondary | ICD-10-CM | POA: Diagnosis not present

## 2016-12-11 DIAGNOSIS — R269 Unspecified abnormalities of gait and mobility: Secondary | ICD-10-CM | POA: Diagnosis not present

## 2016-12-11 DIAGNOSIS — Z9181 History of falling: Secondary | ICD-10-CM | POA: Diagnosis not present

## 2016-12-11 DIAGNOSIS — Z7982 Long term (current) use of aspirin: Secondary | ICD-10-CM | POA: Diagnosis not present

## 2016-12-11 DIAGNOSIS — I10 Essential (primary) hypertension: Secondary | ICD-10-CM | POA: Diagnosis not present

## 2016-12-11 DIAGNOSIS — G309 Alzheimer's disease, unspecified: Secondary | ICD-10-CM | POA: Diagnosis not present

## 2016-12-13 DIAGNOSIS — R269 Unspecified abnormalities of gait and mobility: Secondary | ICD-10-CM | POA: Diagnosis not present

## 2016-12-13 DIAGNOSIS — G309 Alzheimer's disease, unspecified: Secondary | ICD-10-CM | POA: Diagnosis not present

## 2016-12-13 DIAGNOSIS — Z9181 History of falling: Secondary | ICD-10-CM | POA: Diagnosis not present

## 2016-12-13 DIAGNOSIS — K219 Gastro-esophageal reflux disease without esophagitis: Secondary | ICD-10-CM | POA: Diagnosis not present

## 2016-12-13 DIAGNOSIS — I10 Essential (primary) hypertension: Secondary | ICD-10-CM | POA: Diagnosis not present

## 2016-12-13 DIAGNOSIS — Z7982 Long term (current) use of aspirin: Secondary | ICD-10-CM | POA: Diagnosis not present

## 2016-12-15 DIAGNOSIS — K219 Gastro-esophageal reflux disease without esophagitis: Secondary | ICD-10-CM | POA: Diagnosis not present

## 2016-12-15 DIAGNOSIS — Z9181 History of falling: Secondary | ICD-10-CM | POA: Diagnosis not present

## 2016-12-15 DIAGNOSIS — Z7982 Long term (current) use of aspirin: Secondary | ICD-10-CM | POA: Diagnosis not present

## 2016-12-15 DIAGNOSIS — I10 Essential (primary) hypertension: Secondary | ICD-10-CM | POA: Diagnosis not present

## 2016-12-15 DIAGNOSIS — R269 Unspecified abnormalities of gait and mobility: Secondary | ICD-10-CM | POA: Diagnosis not present

## 2016-12-15 DIAGNOSIS — G309 Alzheimer's disease, unspecified: Secondary | ICD-10-CM | POA: Diagnosis not present

## 2016-12-17 DIAGNOSIS — Z9181 History of falling: Secondary | ICD-10-CM | POA: Diagnosis not present

## 2016-12-17 DIAGNOSIS — K219 Gastro-esophageal reflux disease without esophagitis: Secondary | ICD-10-CM | POA: Diagnosis not present

## 2016-12-17 DIAGNOSIS — R269 Unspecified abnormalities of gait and mobility: Secondary | ICD-10-CM | POA: Diagnosis not present

## 2016-12-17 DIAGNOSIS — Z7982 Long term (current) use of aspirin: Secondary | ICD-10-CM | POA: Diagnosis not present

## 2016-12-17 DIAGNOSIS — G309 Alzheimer's disease, unspecified: Secondary | ICD-10-CM | POA: Diagnosis not present

## 2016-12-17 DIAGNOSIS — I10 Essential (primary) hypertension: Secondary | ICD-10-CM | POA: Diagnosis not present

## 2016-12-18 DIAGNOSIS — I10 Essential (primary) hypertension: Secondary | ICD-10-CM | POA: Diagnosis not present

## 2016-12-18 DIAGNOSIS — K219 Gastro-esophageal reflux disease without esophagitis: Secondary | ICD-10-CM | POA: Diagnosis not present

## 2016-12-18 DIAGNOSIS — Z7982 Long term (current) use of aspirin: Secondary | ICD-10-CM | POA: Diagnosis not present

## 2016-12-18 DIAGNOSIS — G309 Alzheimer's disease, unspecified: Secondary | ICD-10-CM | POA: Diagnosis not present

## 2016-12-18 DIAGNOSIS — Z9181 History of falling: Secondary | ICD-10-CM | POA: Diagnosis not present

## 2016-12-18 DIAGNOSIS — R269 Unspecified abnormalities of gait and mobility: Secondary | ICD-10-CM | POA: Diagnosis not present

## 2016-12-19 DIAGNOSIS — K219 Gastro-esophageal reflux disease without esophagitis: Secondary | ICD-10-CM | POA: Diagnosis not present

## 2016-12-19 DIAGNOSIS — I10 Essential (primary) hypertension: Secondary | ICD-10-CM | POA: Diagnosis not present

## 2016-12-19 DIAGNOSIS — Z9181 History of falling: Secondary | ICD-10-CM | POA: Diagnosis not present

## 2016-12-19 DIAGNOSIS — R269 Unspecified abnormalities of gait and mobility: Secondary | ICD-10-CM | POA: Diagnosis not present

## 2016-12-19 DIAGNOSIS — G309 Alzheimer's disease, unspecified: Secondary | ICD-10-CM | POA: Diagnosis not present

## 2016-12-19 DIAGNOSIS — Z7982 Long term (current) use of aspirin: Secondary | ICD-10-CM | POA: Diagnosis not present

## 2016-12-20 DIAGNOSIS — Z9181 History of falling: Secondary | ICD-10-CM | POA: Diagnosis not present

## 2016-12-20 DIAGNOSIS — R600 Localized edema: Secondary | ICD-10-CM | POA: Diagnosis not present

## 2016-12-20 DIAGNOSIS — Z79899 Other long term (current) drug therapy: Secondary | ICD-10-CM | POA: Diagnosis not present

## 2016-12-20 DIAGNOSIS — R269 Unspecified abnormalities of gait and mobility: Secondary | ICD-10-CM | POA: Diagnosis not present

## 2016-12-20 DIAGNOSIS — Z7982 Long term (current) use of aspirin: Secondary | ICD-10-CM | POA: Diagnosis not present

## 2016-12-20 DIAGNOSIS — K219 Gastro-esophageal reflux disease without esophagitis: Secondary | ICD-10-CM | POA: Diagnosis not present

## 2016-12-20 DIAGNOSIS — G309 Alzheimer's disease, unspecified: Secondary | ICD-10-CM | POA: Diagnosis not present

## 2016-12-20 DIAGNOSIS — I10 Essential (primary) hypertension: Secondary | ICD-10-CM | POA: Diagnosis not present

## 2016-12-25 DIAGNOSIS — Z7982 Long term (current) use of aspirin: Secondary | ICD-10-CM | POA: Diagnosis not present

## 2016-12-25 DIAGNOSIS — Z9181 History of falling: Secondary | ICD-10-CM | POA: Diagnosis not present

## 2016-12-25 DIAGNOSIS — R269 Unspecified abnormalities of gait and mobility: Secondary | ICD-10-CM | POA: Diagnosis not present

## 2016-12-25 DIAGNOSIS — K219 Gastro-esophageal reflux disease without esophagitis: Secondary | ICD-10-CM | POA: Diagnosis not present

## 2016-12-25 DIAGNOSIS — G309 Alzheimer's disease, unspecified: Secondary | ICD-10-CM | POA: Diagnosis not present

## 2016-12-25 DIAGNOSIS — I10 Essential (primary) hypertension: Secondary | ICD-10-CM | POA: Diagnosis not present

## 2016-12-27 DIAGNOSIS — K219 Gastro-esophageal reflux disease without esophagitis: Secondary | ICD-10-CM | POA: Diagnosis not present

## 2016-12-27 DIAGNOSIS — R2689 Other abnormalities of gait and mobility: Secondary | ICD-10-CM | POA: Diagnosis not present

## 2016-12-27 DIAGNOSIS — M25552 Pain in left hip: Secondary | ICD-10-CM | POA: Diagnosis not present

## 2016-12-27 DIAGNOSIS — Z79899 Other long term (current) drug therapy: Secondary | ICD-10-CM | POA: Diagnosis not present

## 2016-12-27 DIAGNOSIS — I10 Essential (primary) hypertension: Secondary | ICD-10-CM | POA: Diagnosis not present

## 2016-12-27 DIAGNOSIS — R269 Unspecified abnormalities of gait and mobility: Secondary | ICD-10-CM | POA: Diagnosis not present

## 2016-12-27 DIAGNOSIS — G309 Alzheimer's disease, unspecified: Secondary | ICD-10-CM | POA: Diagnosis not present

## 2016-12-27 DIAGNOSIS — Z9181 History of falling: Secondary | ICD-10-CM | POA: Diagnosis not present

## 2016-12-27 DIAGNOSIS — Z7982 Long term (current) use of aspirin: Secondary | ICD-10-CM | POA: Diagnosis not present

## 2017-01-10 DIAGNOSIS — Z79899 Other long term (current) drug therapy: Secondary | ICD-10-CM | POA: Diagnosis not present

## 2017-01-10 DIAGNOSIS — M25552 Pain in left hip: Secondary | ICD-10-CM | POA: Diagnosis not present

## 2017-01-10 DIAGNOSIS — K4090 Unilateral inguinal hernia, without obstruction or gangrene, not specified as recurrent: Secondary | ICD-10-CM | POA: Diagnosis not present

## 2017-01-10 DIAGNOSIS — G308 Other Alzheimer's disease: Secondary | ICD-10-CM | POA: Diagnosis not present

## 2017-01-10 DIAGNOSIS — D508 Other iron deficiency anemias: Secondary | ICD-10-CM | POA: Diagnosis not present

## 2017-01-14 ENCOUNTER — Ambulatory Visit: Payer: Medicare Other | Admitting: Family Medicine

## 2017-01-15 DIAGNOSIS — R222 Localized swelling, mass and lump, trunk: Secondary | ICD-10-CM | POA: Diagnosis not present

## 2017-01-17 DIAGNOSIS — K4090 Unilateral inguinal hernia, without obstruction or gangrene, not specified as recurrent: Secondary | ICD-10-CM | POA: Diagnosis not present

## 2017-01-17 DIAGNOSIS — R600 Localized edema: Secondary | ICD-10-CM | POA: Diagnosis not present

## 2017-01-17 DIAGNOSIS — Z79899 Other long term (current) drug therapy: Secondary | ICD-10-CM | POA: Diagnosis not present

## 2017-01-17 DIAGNOSIS — K219 Gastro-esophageal reflux disease without esophagitis: Secondary | ICD-10-CM | POA: Diagnosis not present

## 2017-01-25 ENCOUNTER — Encounter: Payer: Self-pay | Admitting: Emergency Medicine

## 2017-01-25 ENCOUNTER — Emergency Department: Payer: Medicare Other

## 2017-01-25 ENCOUNTER — Other Ambulatory Visit: Payer: Self-pay

## 2017-01-25 ENCOUNTER — Inpatient Hospital Stay: Payer: Medicare Other

## 2017-01-25 ENCOUNTER — Inpatient Hospital Stay
Admission: EM | Admit: 2017-01-25 | Discharge: 2017-01-28 | DRG: 871 | Disposition: A | Discharge status: Hospice | Payer: Medicare Other | Attending: Internal Medicine | Admitting: Internal Medicine

## 2017-01-25 DIAGNOSIS — I700 Atherosclerosis of aorta: Secondary | ICD-10-CM | POA: Diagnosis present

## 2017-01-25 DIAGNOSIS — K5730 Diverticulosis of large intestine without perforation or abscess without bleeding: Secondary | ICD-10-CM | POA: Diagnosis present

## 2017-01-25 DIAGNOSIS — Z79899 Other long term (current) drug therapy: Secondary | ICD-10-CM | POA: Diagnosis not present

## 2017-01-25 DIAGNOSIS — Z66 Do not resuscitate: Secondary | ICD-10-CM | POA: Diagnosis present

## 2017-01-25 DIAGNOSIS — R571 Hypovolemic shock: Secondary | ICD-10-CM | POA: Diagnosis present

## 2017-01-25 DIAGNOSIS — R1111 Vomiting without nausea: Secondary | ICD-10-CM | POA: Diagnosis not present

## 2017-01-25 DIAGNOSIS — R627 Adult failure to thrive: Secondary | ICD-10-CM | POA: Diagnosis not present

## 2017-01-25 DIAGNOSIS — K5669 Other partial intestinal obstruction: Secondary | ICD-10-CM | POA: Diagnosis not present

## 2017-01-25 DIAGNOSIS — A419 Sepsis, unspecified organism: Secondary | ICD-10-CM | POA: Diagnosis not present

## 2017-01-25 DIAGNOSIS — E872 Acidosis, unspecified: Secondary | ICD-10-CM

## 2017-01-25 DIAGNOSIS — Z4659 Encounter for fitting and adjustment of other gastrointestinal appliance and device: Secondary | ICD-10-CM

## 2017-01-25 DIAGNOSIS — R6521 Severe sepsis with septic shock: Secondary | ICD-10-CM | POA: Diagnosis present

## 2017-01-25 DIAGNOSIS — K56609 Unspecified intestinal obstruction, unspecified as to partial versus complete obstruction: Secondary | ICD-10-CM | POA: Diagnosis not present

## 2017-01-25 DIAGNOSIS — F0390 Unspecified dementia without behavioral disturbance: Secondary | ICD-10-CM | POA: Diagnosis present

## 2017-01-25 DIAGNOSIS — Z7982 Long term (current) use of aspirin: Secondary | ICD-10-CM

## 2017-01-25 DIAGNOSIS — R748 Abnormal levels of other serum enzymes: Secondary | ICD-10-CM | POA: Diagnosis not present

## 2017-01-25 DIAGNOSIS — K4030 Unilateral inguinal hernia, with obstruction, without gangrene, not specified as recurrent: Secondary | ICD-10-CM | POA: Diagnosis not present

## 2017-01-25 DIAGNOSIS — I10 Essential (primary) hypertension: Secondary | ICD-10-CM | POA: Diagnosis present

## 2017-01-25 DIAGNOSIS — J189 Pneumonia, unspecified organism: Secondary | ICD-10-CM | POA: Diagnosis present

## 2017-01-25 DIAGNOSIS — Z515 Encounter for palliative care: Secondary | ICD-10-CM | POA: Diagnosis present

## 2017-01-25 DIAGNOSIS — J690 Pneumonitis due to inhalation of food and vomit: Secondary | ICD-10-CM | POA: Diagnosis present

## 2017-01-25 DIAGNOSIS — Z87891 Personal history of nicotine dependence: Secondary | ICD-10-CM | POA: Diagnosis not present

## 2017-01-25 DIAGNOSIS — I248 Other forms of acute ischemic heart disease: Secondary | ICD-10-CM | POA: Diagnosis present

## 2017-01-25 DIAGNOSIS — R1110 Vomiting, unspecified: Secondary | ICD-10-CM | POA: Diagnosis not present

## 2017-01-25 DIAGNOSIS — Z4682 Encounter for fitting and adjustment of non-vascular catheter: Secondary | ICD-10-CM | POA: Diagnosis not present

## 2017-01-25 HISTORY — DX: Unspecified dementia, unspecified severity, without behavioral disturbance, psychotic disturbance, mood disturbance, and anxiety: F03.90

## 2017-01-25 LAB — COMPREHENSIVE METABOLIC PANEL
ALBUMIN: 3.6 g/dL (ref 3.5–5.0)
ALT: 14 U/L — AB (ref 17–63)
AST: 29 U/L (ref 15–41)
Alkaline Phosphatase: 80 U/L (ref 38–126)
Anion gap: 15 (ref 5–15)
BUN: 32 mg/dL — AB (ref 6–20)
CHLORIDE: 99 mmol/L — AB (ref 101–111)
CO2: 24 mmol/L (ref 22–32)
CREATININE: 1.26 mg/dL — AB (ref 0.61–1.24)
Calcium: 9.3 mg/dL (ref 8.9–10.3)
GFR calc Af Amer: 55 mL/min — ABNORMAL LOW (ref >60)
GFR, EST NON AFRICAN AMERICAN: 47 mL/min — AB (ref >60)
GLUCOSE: 173 mg/dL — AB (ref 65–99)
Potassium: 3.8 mmol/L (ref 3.5–5.1)
Sodium: 138 mmol/L (ref 135–145)
Total Bilirubin: 1.3 mg/dL — ABNORMAL HIGH (ref 0.3–1.2)
Total Protein: 6.5 g/dL (ref 6.5–8.1)

## 2017-01-25 LAB — CBC WITH DIFFERENTIAL/PLATELET
BASOS ABS: 0.1 10*3/uL (ref 0–0.1)
Basophils Relative: 1 %
EOS PCT: 0 %
Eosinophils Absolute: 0 10*3/uL (ref 0–0.7)
HCT: 38.3 % — ABNORMAL LOW (ref 40.0–52.0)
Hemoglobin: 12.6 g/dL — ABNORMAL LOW (ref 13.0–18.0)
LYMPHS PCT: 6 %
Lymphs Abs: 0.5 10*3/uL — ABNORMAL LOW (ref 1.0–3.6)
MCH: 30.8 pg (ref 26.0–34.0)
MCHC: 32.9 g/dL (ref 32.0–36.0)
MCV: 93.8 fL (ref 80.0–100.0)
Monocytes Absolute: 0.1 10*3/uL — ABNORMAL LOW (ref 0.2–1.0)
Monocytes Relative: 1 %
NEUTROS ABS: 6.9 10*3/uL — AB (ref 1.4–6.5)
Neutrophils Relative %: 92 %
PLATELETS: 255 10*3/uL (ref 150–440)
RBC: 4.08 MIL/uL — AB (ref 4.40–5.90)
RDW: 16 % — ABNORMAL HIGH (ref 11.5–14.5)
WBC: 7.5 10*3/uL (ref 3.8–10.6)

## 2017-01-25 LAB — URINALYSIS, COMPLETE (UACMP) WITH MICROSCOPIC
Bacteria, UA: NONE SEEN
Bilirubin Urine: NEGATIVE
GLUCOSE, UA: NEGATIVE mg/dL
Hgb urine dipstick: NEGATIVE
Ketones, ur: NEGATIVE mg/dL
Leukocytes, UA: NEGATIVE
Nitrite: NEGATIVE
PH: 6 (ref 5.0–8.0)
Protein, ur: 30 mg/dL — AB
SPECIFIC GRAVITY, URINE: 1.02 (ref 1.005–1.030)
Squamous Epithelial / LPF: NONE SEEN

## 2017-01-25 LAB — LACTIC ACID, PLASMA
Lactic Acid, Venous: 4.1 mmol/L (ref 0.5–1.9)
Lactic Acid, Venous: 3.6 mmol/L (ref 0.5–1.9)
Lactic Acid, Venous: 3.5 mmol/L (ref 0.5–1.9)

## 2017-01-25 LAB — MRSA PCR SCREENING: MRSA by PCR: NEGATIVE

## 2017-01-25 LAB — LIPASE, BLOOD: LIPASE: 25 U/L (ref 11–51)

## 2017-01-25 LAB — TROPONIN I: Troponin I: 0.03 ng/mL (ref <0.03)

## 2017-01-25 MED ORDER — ONDANSETRON HCL 4 MG/2ML IJ SOLN
4.0000 mg | Freq: Once | INTRAMUSCULAR | Status: AC
  Administered 2017-01-25: 4 mg via INTRAVENOUS
  Filled 2017-01-25: qty 2

## 2017-01-25 MED ORDER — SODIUM CHLORIDE 0.9 % IV BOLUS (SEPSIS)
1000.0000 mL | Freq: Once | INTRAVENOUS | Status: AC
  Administered 2017-01-25: 1000 mL via INTRAVENOUS

## 2017-01-25 MED ORDER — SODIUM CHLORIDE 0.9 % IV SOLN
INTRAVENOUS | Status: DC
  Administered 2017-01-25 – 2017-01-26 (×3): via INTRAVENOUS

## 2017-01-25 MED ORDER — AZITHROMYCIN 250 MG PO TABS
500.0000 mg | ORAL_TABLET | ORAL | Status: DC
  Filled 2017-01-25: qty 2

## 2017-01-25 MED ORDER — PIPERACILLIN-TAZOBACTAM 3.375 G IVPB 30 MIN
3.3750 g | Freq: Once | INTRAVENOUS | Status: AC
  Administered 2017-01-25: 3.375 g via INTRAVENOUS
  Filled 2017-01-25: qty 50

## 2017-01-25 MED ORDER — SODIUM CHLORIDE 0.9 % IV BOLUS (SEPSIS)
500.0000 mL | Freq: Once | INTRAVENOUS | Status: AC
  Administered 2017-01-25: 500 mL via INTRAVENOUS

## 2017-01-25 MED ORDER — VANCOMYCIN HCL IN DEXTROSE 1-5 GM/200ML-% IV SOLN
1000.0000 mg | Freq: Once | INTRAVENOUS | Status: AC
  Administered 2017-01-25: 1000 mg via INTRAVENOUS
  Filled 2017-01-25: qty 200

## 2017-01-25 MED ORDER — DEXTROSE 5 % IV SOLN
1.0000 g | INTRAVENOUS | Status: DC
  Administered 2017-01-25: 1 g via INTRAVENOUS
  Filled 2017-01-25 (×3): qty 10

## 2017-01-25 MED ORDER — IOPAMIDOL (ISOVUE-300) INJECTION 61%
75.0000 mL | Freq: Once | INTRAVENOUS | Status: AC | PRN
  Administered 2017-01-25: 75 mL via INTRAVENOUS

## 2017-01-25 MED ORDER — ENOXAPARIN SODIUM 40 MG/0.4ML ~~LOC~~ SOLN
40.0000 mg | SUBCUTANEOUS | Status: DC
  Administered 2017-01-25: 40 mg via SUBCUTANEOUS
  Filled 2017-01-25: qty 0.4

## 2017-01-25 NOTE — H&P (Signed)
[facility] [place] [facility]   PATIENT NAME: Derek Hancock Inocencio    MR#:  [id number]  DATE OF BIRTH:  06/19/1921  DATE OF ADMISSION:  01/25/2017  PRIMARY CARE PHYSICIAN: Maple HudsonGilbert, Richard L Jr., MD   REQUESTING/REFERRING PHYSICIAN: Emily FilbertWilliams, Jonathan E, MD  CHIEF COMPLAINT:   Chief Complaint  Patient presents with  . Emesis   HISTORY OF PRESENT ILLNESS:  Derek Hancock Goth[place]  is a 83 y.o. male with a known history of dementia and hypertension who presents to the ED for vomiting.  Reportedly he had several episodes of vomiting today.  Reportedly from his facility it resembled feculent matter.  Patient does complain of some abdominal pain but denies any nausea at this time. Patient's imaging revealed a partial small bowel obstruction from incarcerated inguinal hernia. Dr Mayford KnifeWilliams manually reduced the right inguinal hernia and ordered an NG tube per Surgicalist Dr Everlene FarrierPabon request. Considering Pneumonia Dr Everlene FarrierPabon requested Medicine to admit the patient. Patient is unable to provide any history.  Most of the history was obtained from patient's son who is at the bedside and EDP Dr. Mayford KnifeWilliams and medical records. PAST MEDICAL HISTORY:   Past Medical History:  Diagnosis Date  . Elevated blood pressure     PAST SURGICAL HISTORY:   Past Surgical History:  Procedure Laterality Date  . CATARACT EXTRACTION, BILATERAL    . COLONOSCOPY    . CYST EXCISION     at the end of the spine  . HERNIA REPAIR  1970  . MELANOMA EXCISION      SOCIAL HISTORY:   Social History   Tobacco Use  . Smoking status: Former Smoker    Last attempt to quit: 03/05/1968    Years since quitting: 48.9  . Smokeless tobacco: Never Used  Substance Use Topics  . Alcohol use: No    Alcohol/week: 0.0 oz    FAMILY HISTORY:   Family History  Problem Relation Age of Onset  . Heart disease Mother   . Heart disease Father     DRUG ALLERGIES:  No Known Allergies  REVIEW OF SYSTEMS:   Review of Systems    Unable to perform ROS: Critical illness   MEDICATIONS AT HOME:   Prior to Admission medications   Medication Sig Start Date End Date Taking? Authorizing Provider  aspirin 81 MG tablet Take 81 mg by mouth daily.   Yes [provider]  citalopram (CELEXA) 10 MG tablet Take 10 mg by mouth daily.   Yes [provider]  ferrous sulfate 325 (65 FE) MG EC tablet Take 325 mg by mouth 2 (two) times daily.   Yes [provider]  furosemide (LASIX) 20 MG tablet Take 20 mg by mouth daily.   Yes [provider]  loperamide (HM LOPERAMIDE HCL) 2 MG capsule Take 2 mg by mouth 4 (four) times daily as needed for diarrhea or loose stools.   Yes [provider]  omeprazole (PRILOSEC) 20 MG capsule Take 1 capsule (20 mg total) by mouth daily. 07/11/16  Yes Maple HudsonGilbert, Richard L Jr., MD  potassium chloride (K-DUR,KLOR-CON) 10 MEQ tablet Take 10 mEq by mouth daily.   Yes [provider]  Acetaminophen (TYLENOL PO) Take 1,000 mg by mouth 2 (two) times daily.     [provider]  hydrochlorothiazide (MICROZIDE) 12.5 MG capsule Take 1 capsule (12.5 mg total) by mouth daily. Patient not taking: Reported on 01/25/2017 08/11/15   Maple HudsonGilbert, Richard L Jr., MD  VITAL SIGNS:  Blood pressure 123/65, pulse (!) 106, temperature (!) 97.5 F (36.4 C), temperature source Oral, resp. rate (!) 26, height 5\' 7"  (1.702 m), weight 67.6 kg (149 lb), SpO2 94 %.  PHYSICAL EXAMINATION:  Physical Exam  GENERAL:  83 y.o.-year-old patient lying in the bed with moderate acute distress.  EYES: Pupils equal, round, reactive to light and accommodation. No scleral icterus. Extraocular muscles intact.  HEENT: Head atraumatic, normocephalic. Oropharynx and nasopharynx clear.  NECK:  Supple, no jugular venous distention. No thyroid enlargement, no tenderness.  LUNGS: Decreased breath sounds bilaterally, no wheezing, rales,rhonchi or crepitation. No use of accessory muscles of  respiration.  CARDIOVASCULAR: S1, S2 normal. No murmurs, rubs, or gallops.  ABDOMEN: Soft, Generalized tenderness, Mildly distended. Bowel sounds hypoactive. No organomegaly or mass.  EXTREMITIES: No pedal edema, cyanosis, or clubbing.  NEUROLOGIC: Demented, difficult to evaluate, wouldn't cooperate with exam.  PSYCHIATRIC: The patient is sleepy SKIN: No obvious rash, lesion, or ulcer.  LABORATORY PANEL:   CBC Recent Labs  Lab 01/25/17 0844  WBC 7.5  HGB 12.6*  HCT 38.3*  PLT 255   ------------------------------------------------------------------------------------------------------------------  Chemistries  Recent Labs  Lab 01/25/17 0844  NA 138  K 3.8  CL 99*  CO2 24  GLUCOSE 173*  BUN 32*  CREATININE 1.26*  CALCIUM 9.3  AST 29  ALT 14*  ALKPHOS 80  BILITOT 1.3*   ------------------------------------------------------------------------------------------------------------------  Cardiac Enzymes Recent Labs  Lab 01/25/17 0844  TROPONINI 0.03*   ------------------------------------------------------------------------------------------------------------------  RADIOLOGY:  Dg Chest 1 View  Result Date: 01/25/2017 CLINICAL DATA:  Vomiting possibly dark brown stool. Right-sided abdominal pain previous hernia in that region. EXAM: CHEST 1 VIEW COMPARISON:  None. FINDINGS: Lungs are somewhat hypoinflated with bibasilar opacification possibly multifocal pneumonia. No definite effusion. Cardiomediastinal silhouette is within normal. Air under the left hemidiaphragm which may be intraluminal or possible free peritoneal air. Remainder of the exam is unremarkable. IMPRESSION: Bibasilar patchy airspace process likely pneumonia. Air under the left hemidiaphragm which could represent free peritoneal air. Suggest further evaluation with upright PA and lateral chest radiograph with possible decubitus film or abdominal/pelvic CT. These results were called by telephone at the time of  interpretation on 01/25/2017 at 9:46 am to Dr. Daryel November , who verbally acknowledged these results. Electronically Signed   By: Elberta Fortis M.D.   On: 01/25/2017 09:46   Ct Abdomen Pelvis W Contrast  Result Date: 01/25/2017 CLINICAL DATA:  83 year old presenting from the nursing home with 1 episode of possible feculent vomiting. Known right inguinal hernia with recent pain in the right inguinal region. Fever up to 100.8 degrees Fahrenheit. Surgical history includes unspecified hernia repair in 1970. EXAM: CT ABDOMEN AND PELVIS WITH CONTRAST TECHNIQUE: Multidetector CT imaging of the abdomen and pelvis was performed using the standard protocol following bolus administration of intravenous contrast. CONTRAST:  75mL ISOVUE-300 IOPAMIDOL INJECTION 61% IV. COMPARISON:  None. FINDINGS: Lower chest: Patchy airspace opacities throughout the visualized lung bases bilaterally, right greater than left. Apparent nodular opacities in the right middle lobe and lingula are likely nodular acinar airspace opacities. No pleural effusions. Mild cardiomegaly. Severe aortic valvular calcification. Mitral annular calcification. No pericardial effusion. No visible coronary atherosclerosis. Hepatobiliary: Approximate 2.2 x 3.1 x 3.5 cm mass involving the medial segment left lobe of liver at the dome (series 2, image 22 and coronal image 13), demonstrating nodular peripheral enhancement on the immediate images with further enhancement on the delayed images. No other focal hepatic parenchymal abnormality. Gallbladder normal  in appearance without calcified gallstones. No biliary ductal dilation. Pancreas: Moderate to severe diffuse atrophy. No mass or peripancreatic inflammation. Spleen: Normal in size and appearance. Adrenals/Urinary Tract: Normal appearing adrenal glands. No urinary tract calculi or obstruction on either side. Mild diffuse cortical thinning involving both kidneys consistent with age. Small cortical cysts in  both kidneys. No suspicious solid renal masses. No hydronephrosis. Urinary bladder decompressed and unremarkable. Stomach/Bowel: Stomach distended with fluid. Numerous dilated loops of small bowel throughout the abdomen and pelvis. Right inguinal hernia containing small bowel, right inguinal hernia containing a dilated loop of small bowel, with fluid in the hernia sac adjacent to the dilated loop. The small bowel distal to the hernia is decompressed. Diffuse colonic diverticulosis without evidence of acute diverticulitis. Moderate diffuse colonic stool burden and large stool burden in the rectum. Normal appendix in the right upper pelvis. Vascular/Lymphatic: Moderate to severe aortoiliofemoral atherosclerosis without evidence of aneurysm. Normal-appearing portal venous and systemic venous systems. No pathologic lymphadenopathy. Reproductive: Moderate to marked prostate gland enlargement, particularly the median lobe. Normal seminal vesicles. Other: None. Musculoskeletal: Multilevel degenerative disc disease, spondylosis and facet degenerative changes throughout the lower thoracic and lumbar spine, worst at L1-2 and L2-3. Osseous demineralization. Thoracolumbar levoscoliosis. Benign bone island in the first left sacral ala. No acute osseous abnormality. IMPRESSION: 1. Partial small bowel obstruction related to obstruction of a loop of bowel within a right inguinal hernia. No free intraperitoneal air. 2. Pneumonia involving the visualized lung bases bilaterally. Apparent nodules in the right middle lobe and lingula are felt to represent airspace disease rather than true nodules. A follow-up CT chest after treatment may be helpful to confirm resolution. 3. Diffuse colonic diverticulosis without evidence of acute diverticulitis. Moderate diffuse colonic stool burden and large rectal stool burden. 4. Benign hemangioma involving the medial segment left lobe of liver. 5. Mild cardiomegaly.  Severe aortic valvular  calcification. 6. Pancreatic atrophy. 7. Moderate to marked prostate gland enlargement, particularly the median lobe. 8.  Aortic Atherosclerosis (ICD10-170.0) Electronically Signed   By: Hulan Saashomas  Lawrence M.D.   On: 01/25/2017 10:21   IMPRESSION AND PLAN:  83 year old male being admitted for partial small bowel obstruction bilateral pneumonia  * Partial SBO - NGT suction, bowel rest, n.p.o. for now - Surgicalist c/s -Replete electrolytes and treatment of pneumonia  *Bilateral pneumonia -IV Rocephin and Zithromax  *Lactic acidosis -Aggressive IV hydration and monitor -Likely due to infection  *Elevated troponin -Likely due to demand ischemia  Overall patient is critically sick and high risk for cardiorespiratory failure and multiorgan failure including death.  Family is well aware and were updated at bedside.  They are agreeable to keep conservative management and choose hospice if appropriate at discharge. we will consult palliative care      All the records are reviewed and case discussed with ED provider. Management plans discussed with the patient, family (son at bedside) and they are in agreement.  CODE STATUS: DNR  TOTAL TIME (Critical Care)TAKING CARE OF THIS PATIENT: 45 minutes.    Delfino LovettVipul Avyn Coate M.D on 01/25/2017 at 12:28 PM  Between 7am to 6pm - Pager - [phone]  After 6pm go to [email or web address] - password [email or web address] [facility]  [facility] [place] [facility]  Office  [phone]  CC: Primary care physician; Maple HudsonGilbert, Richard L Jr., MD   Note: This dictation was prepared with Dragon dictation along with smaller phrase technology. Any transcriptional errors that result from this process are unintentional.

## 2017-01-25 NOTE — ED Notes (Addendum)
Unable to call report d/t room assignment changing.  Report given to Tresa EndoKelly, RN until room can be assigned.

## 2017-01-25 NOTE — Progress Notes (Signed)
Nursing called for low BP. Will increase maintenance fluid to 125 cc/hr and give 2 liter bolus for now. If no improvement, may need to go to ICU/step-down for pressors.

## 2017-01-25 NOTE — ED Notes (Signed)
Date and time results received: 01/25/17 0945   Test: Lactic Critical Value: 4.1  Name of Provider Notified: Dr. Mayford KnifeWilliams  Orders Received? Or Actions Taken?: Acknowledged, NS ordered.

## 2017-01-25 NOTE — ED Triage Notes (Signed)
Patient to ER via [facility] from [facility] for c/o vomiting. Patient reported to have vomited x1 dark brown stool that staff believed to be feces. Patient has h/o hernia to right sided abd, has been c/o pain to same area. Patient denies any other problems, but has h/o dementia. Per staff, temp was 100.8.

## 2017-01-25 NOTE — ED Notes (Signed)
Skin PWD. Resp even and non-labored.

## 2017-01-25 NOTE — Progress Notes (Signed)
Contacted Dr. Sherryll BurgerShah regarding critical lactic acid levels 3.5 via pager, he returned call and stated " thank you" no new orders given.

## 2017-01-25 NOTE — ED Notes (Signed)
Date and time results received: 01/25/17 0930  Test: Troponin Critical Value: 0.03  Name of Provider Notified: Dr. Mayford KnifeWilliams  Orders Received? Or Actions Taken?: Acknowledged

## 2017-01-25 NOTE — ED Provider Notes (Signed)
[facility] Emergency Department Provider Note       Time seen: ----------------------------------------- 8:21 AM on 01/25/2017 ----------------------------------------- Level V caveat: History/ROS limited by dementia  I have reviewed the triage vital signs and the nursing notes.  HISTORY   Chief Complaint No chief complaint on file.    HPI Derek Hancock is a 83 y.o. male with a history of dementia and hypertension who presents to the ED for vomiting.  Reportedly he had several episodes of vomiting today.  Reportedly from his facility it resembled feculent matter.  Patient does complain of some abdominal pain but denies any nausea at this time.  Past Medical History:  Diagnosis Date  . Elevated blood pressure     Patient Active Problem List   Diagnosis Date Noted  . PSVT (paroxysmal supraventricular tachycardia) (HCC) 03/26/2016  . Bilateral carotid artery stenosis 03/01/2016  . Dementia 01/11/2016  . Hypertension 09/15/2014  . History of tobacco use 09/15/2014  . Cardiac murmur 09/15/2014    Past Surgical History:  Procedure Laterality Date  . CATARACT EXTRACTION, BILATERAL    . COLONOSCOPY    . CYST EXCISION     at the end of the spine  . HERNIA REPAIR  1970  . MELANOMA EXCISION      Allergies Patient has no known allergies.  Social History Social History   Tobacco Use  . Smoking status: Former Smoker    Last attempt to quit: 03/05/1968    Years since quitting: 48.9  . Smokeless tobacco: Never Used  Substance Use Topics  . Alcohol use: No    Alcohol/week: 0.0 oz  . Drug use: No    Review of Systems Constitutional: Negative for fever. Cardiovascular: Negative for chest pain. Respiratory: Negative for shortness of breath. Gastrointestinal: Positive for abdominal pain and vomiting Genitourinary: Negative for dysuria. Musculoskeletal: Negative for back pain. Skin: Negative for rash. Neurological: Negative for headaches, focal  weakness or numbness.  All systems negative/normal/unremarkable except as stated in the HPI  ____________________________________________   PHYSICAL EXAM:  VITAL SIGNS: ED Triage Vitals  Enc Vitals Group     BP      Pulse      Resp      Temp      Temp src      SpO2      Weight      Height      Head Circumference      Peak Flow      Pain Score      Pain Loc      Pain Edu?      Excl. in GC?     Constitutional: Alert but disoriented distress Eyes: Conjunctivae are normal. Normal extraocular movements. ENT   Head: Normocephalic and atraumatic.   Nose: No congestion/rhinnorhea.   Mouth/Throat: Mucous membranes are moist.   Neck: No stridor. Cardiovascular: Normal rate, regular rhythm.  Systolic murmur is noted Respiratory: Normal respiratory effort without tachypnea nor retractions. Breath sounds are clear and equal bilaterally. No wheezes/rales/rhonchi. Gastrointestinal: Nonfocal abdominal tenderness, no rebound or guarding.  Normal bowel sounds. Musculoskeletal: Nontender with normal range of motion in extremities. No lower extremity tenderness nor edema. Neurologic:  Normal speech and language. No gross focal neurologic deficits are appreciated.  Skin:  Skin is warm, dry and intact. No rash noted. Psychiatric: Mood and affect are normal. Speech and behavior are normal.  ____________________________________________  EKG: Interpreted by me.  Sinus tachycardia with a rate of 106 bpm, normal PR interval,  LVH, nonspecific ST segment changes  ____________________________________________  ED COURSE:  Pertinent labs & imaging results that were available during my care of the patient were reviewed by me and considered in my medical decision making (see chart for details). Patient presents for abdominal pain and vomiting, we will assess with labs and imaging as indicated.   Procedures ____________________________________________   LABS (pertinent  positives/negatives)  Labs Reviewed  CBC WITH DIFFERENTIAL/PLATELET - Abnormal; Notable for the following components:      Result Value   RBC 4.08 (*)    Hemoglobin 12.6 (*)    HCT 38.3 (*)    RDW 16.0 (*)    Neutro Abs 6.9 (*)    Lymphs Abs 0.5 (*)    Monocytes Absolute 0.1 (*)    All other components within normal limits  COMPREHENSIVE METABOLIC PANEL - Abnormal; Notable for the following components:   Chloride 99 (*)    Glucose, Bld 173 (*)    BUN 32 (*)    Creatinine, Ser 1.26 (*)    ALT 14 (*)    Total Bilirubin 1.3 (*)    GFR calc non Af Amer 47 (*)    GFR calc Af Amer 55 (*)    All other components within normal limits  TROPONIN I - Abnormal; Notable for the following components:   Troponin I 0.03 (*)    All other components within normal limits  URINALYSIS, COMPLETE (UACMP) WITH MICROSCOPIC - Abnormal; Notable for the following components:   Color, Urine YELLOW (*)    APPearance CLEAR (*)    Protein, ur 30 (*)    All other components within normal limits  LACTIC ACID, PLASMA - Abnormal; Notable for the following components:   Lactic Acid, Venous 4.1 (*)    All other components within normal limits  LIPASE, BLOOD   CRITICAL CARE Performed by: Emily Filbert   Total critical care time: 30 minutes  Critical care time was exclusive of separately billable procedures and treating other patients.  Critical care was necessary to treat or prevent imminent or life-threatening deterioration.  Critical care was time spent personally by me on the following activities: development of treatment plan with patient and/or surrogate as well as nursing, discussions with consultants, evaluation of patient's response to treatment, examination of patient, obtaining history from patient or surrogate, ordering and performing treatments and interventions, ordering and review of laboratory studies, ordering and review of radiographic studies, pulse oximetry and re-evaluation of  patient's condition.  RADIOLOGY Images were viewed by me X-ray IMPRESSION: Bibasilar patchy airspace process likely pneumonia.  Air under the left hemidiaphragm which could represent free peritoneal air. Suggest further evaluation with upright PA and lateral chest radiograph with possible decubitus film or abdominal/pelvic CT.  These results were called by telephone at the time of interpretation on 01/25/2017 at 9:46 am to Dr. Daryel November , who verbally acknowledged these results. CT of the abdomen and pelvis with contrast IMPRESSION: 1. Partial small bowel obstruction related to obstruction of a loop of bowel within a right inguinal hernia. No free intraperitoneal air. 2. Pneumonia involving the visualized lung bases bilaterally. Apparent nodules in the right middle lobe and lingula are felt to represent airspace disease rather than true nodules. A follow-up CT chest after treatment may be helpful to confirm resolution. 3. Diffuse colonic diverticulosis without evidence of acute diverticulitis. Moderate diffuse colonic stool burden and large rectal stool burden. 4. Benign hemangioma involving the medial segment left lobe of liver. 5. Mild cardiomegaly.  Severe aortic valvular calcification. 6. Pancreatic atrophy. 7. Moderate to marked prostate gland enlargement, particularly the median lobe. 8. Aortic Atherosclerosis (ICD10-170.0) ____________________________________________  CRITICAL CARE Performed by: Emily FilbertWilliams, Jonathan E   Total critical care time: 30 minutes  Critical care time was exclusive of separately billable procedures and treating other patients.  Critical care was necessary to treat or prevent imminent or life-threatening deterioration.  Critical care was time spent personally by me on the following activities: development of treatment plan with patient and/or surrogate as well as nursing, discussions with consultants, evaluation of patient's  response to treatment, examination of patient, obtaining history from patient or surrogate, ordering and performing treatments and interventions, ordering and review of laboratory studies, ordering and review of radiographic studies, pulse oximetry and re-evaluation of patient's condition.  DIFFERENTIAL DIAGNOSIS   Gastroenteritis, dehydration, electrolyte abnormality, bowel obstruction, necrotic bowel  FINAL ASSESSMENT AND PLAN  Abdominal pain, vomiting, lactic acidosis, partial small bowel obstruction, inguinal hernia, bilateral pneumonia   Plan: Patient had presented for vomiting and vomiting reportedly very dark material. Patient's labs were concerning for elevated lactic acidosis. Patient's imaging revealed a partial small bowel obstruction from incarcerated inguinal hernia.  I was able to manually reduce the right inguinal hernia but he will is likely still need an NG tube which I have ordered.  I have also ordered broad-spectrum manner biotics to cover for pneumonia and possible sepsis.  I discussed with general surgery who has requested that medicine admit the patient.  Mortality risk associated with these comorbidities is very high.   Emily FilbertWilliams, Jonathan E, MD   Note: This note was generated in part or whole with voice recognition software. Voice recognition is usually quite accurate but there are transcription errors that can and very often do occur. I apologize for any typographical errors that were not detected and corrected.     Emily FilbertWilliams, Jonathan E, MD 01/25/17 [phone]

## 2017-01-25 NOTE — Consult Note (Signed)
Patient ID: Derek GuthrieGeorge D Hancock, male   DOB: [birth year], 83 y.o.   MRN: [id number]  HPI Derek Hancock is a 83 y.o. male asked to see in consultation by Dr. Mayford KnifeWilliams of the emergency room. Case discussed with him in detail. He presented to the emergency room after multiple episodes of fecal vomiting today. Apparently patient has had some abdominal pain. I talked to the [place] we were able to provide a history since the patient has severe dementia. They do note that over the last month or so [name] has been declining significantly from the cognitive aspect and from his dementia aspect. He requires to be fed and dressed. He reports questionable abdominal pain started last night. He did have known that he has a right inguinal hernia for several years and was told not to do any surgical revision. He had a CT scan of the abdomen and pelvis that I have personally reviewed showing evidence of a partial small bowel obstruction with incarceration of the right inguinal area. Dr. Mayford KnifeWilliams was able to manually reduce the incarceration the patient feels better. Also in addition to this patient has bilateral pneumonias likely from aspiration. His lactic acid was asked to 4.1 white count 7.5, creatinine 1.2. Patient currently a full code  HPI  Past Medical History:  Diagnosis Date  . Dementia   . Elevated blood pressure     Past Surgical History:  Procedure Laterality Date  . CATARACT EXTRACTION, BILATERAL    . COLONOSCOPY    . CYST EXCISION     at the end of the spine  . HERNIA REPAIR  1970  . MELANOMA EXCISION      Family History  Problem Relation Age of Onset  . Heart disease Mother   . Heart disease Father     Social History Social History   Tobacco Use  . Smoking status: Former Smoker    Last attempt to quit: 03/05/1968    Years since quitting: 48.9  . Smokeless tobacco: Never Used  Substance Use Topics  . Alcohol use: No    Alcohol/week: 0.0 oz  . Drug use: No    No Known  Allergies  Current Facility-Administered Medications  Medication Dose Route Frequency Provider Last Rate Last Dose  . 0.9 %  sodium chloride infusion   Intravenous Continuous Delfino LovettShah, Vipul, MD 125 mL/hr at 01/25/17 1731    . azithromycin (ZITHROMAX) tablet 500 mg  500 mg Oral Q24H Sherryll BurgerShah, Vipul, MD      . cefTRIAXone (ROCEPHIN) 1 g in dextrose 5 % 50 mL IVPB  1 g Intravenous Q24H Sherryll BurgerShah, Vipul, MD      . enoxaparin (LOVENOX) injection 40 mg  40 mg Subcutaneous Q24H Sherryll BurgerShah, Vipul, MD      . sodium chloride 0.9 % bolus 1,000 mL  1,000 mL Intravenous Once Delfino LovettShah, Vipul, MD         Review of Systems Full ROS  was asked and was negative except for the information on the HPI ( obtained from sons)  Physical Exam Blood pressure (!) 108/48, pulse 84, temperature 97.9 F (36.6 C), temperature source Oral, resp. rate (!) 22, height 5\' 7"  (1.702 m), weight 67.6 kg (149 lb), SpO2 96 %. CONSTITUTIONAL: debilitated elderly male, non verbal currently EYES: Pupils are equal, round, and reactive to light, Sclera are non-icteric. EARS, NOSE, MOUTH AND THROAT: The oropharynx is clear. The oral mucosa is dryt. Hearing is intact to voice. LYMPH NODES:  Lymph nodes in the neck are normal.  RESPIRATORY:  Lungs bilateral ronchi and tachypneic. CARDIOVASCULAR: Heart is regular without murmurs, gallops, or rubs. GI: The abdomen is  soft, nontender, and nondistended. There are no palpable masses. There is no hepatosplenomegaly.  Mild tenderness Right inguinal area w reducible small hernia. No peritonitis GU: Rectal deferred.   MUSCULOSKELETAL: No cyanosis or edema.   SKIN: warm to palpation NEUROLOGIC  No focalizing sings, pt is demented and not verbal at this time., follow minimal commands PSYCH:  Answers yes or no. Not oriented. Eyes closed.  Data Reviewed  I have personally reviewed the patient's imaging, laboratory findings and medical records.    Assessment/ Plan 83 year old demented male with significant  debility and fragility. Presented with an incarcerated inguinal hernia that now has been reduced. However he has persistent bilateral pneumonias and hypovolemic shock from sepsis. Even his age and his advanced dementia as well as his bilateral pneumonias he has a prohibitive surgical risk. I discussed with both of the son about the gravity of the situation and my recommendation again surgical revision.. Scars with him in detail about life of care issues and currently they're thinking about conservative measures and about no major interventions. There is still have not, with the major decision yet. Commend to admit to the hospital aggressive fluid resuscitation as well as NG tube for decompression and broad-spectrum antibiotic therapy for his pneumonia. Unfortunately he has very poor prognosis given all his issues. No surgical or intervention recommended. I will be happy to follow along with you  .     [name], MD FACS General Surgeon 01/25/2017, 5:42 PM

## 2017-01-25 NOTE — ED Notes (Signed)
Bilateral mittens placed to avoid patient pulling NG tube. Patient was previously pulling at O2 cannula.

## 2017-01-25 NOTE — Progress Notes (Signed)
OT Cancellation Note  Patient Details Name: Derek GuthrieGeorge D Hancock MRN: [id number] DOB: May 06, 1921   Cancelled Treatment:    Reason Eval/Treat Not Completed: Medical issues which prohibited therapy. Order received, chart reviewed. Recent lactic acid 3.5. Recent BP 86/38, MD notified and addressing with IVF. Per MD note, if pt does not improve, may require transfer to ICU/step-down for pressors. OT evaluation not appropriate at this time. Will hold OT evaluation this date and re-attempt next date as pt is medically appropriate. If pt does transfer to ICU, will need new OT orders or continue upon transfer orders in place.  Richrd PrimeJamie Stiller, MPH, MS, OTR/L ascom [phone] 01/25/17, 4:44 PM

## 2017-01-26 ENCOUNTER — Inpatient Hospital Stay: Payer: Medicare Other

## 2017-01-26 LAB — BASIC METABOLIC PANEL
Anion gap: 8 (ref 5–15)
BUN: 43 mg/dL — ABNORMAL HIGH (ref 6–20)
CO2: 24 mmol/L (ref 22–32)
Calcium: 7.8 mg/dL — ABNORMAL LOW (ref 8.9–10.3)
Chloride: 109 mmol/L (ref 101–111)
Creatinine, Ser: 1.34 mg/dL — ABNORMAL HIGH (ref 0.61–1.24)
GFR calc Af Amer: 51 mL/min — ABNORMAL LOW (ref >60)
GFR, EST NON AFRICAN AMERICAN: 44 mL/min — AB (ref >60)
GLUCOSE: 94 mg/dL (ref 65–99)
POTASSIUM: 3.9 mmol/L (ref 3.5–5.1)
Sodium: 141 mmol/L (ref 135–145)

## 2017-01-26 LAB — CBC
HEMATOCRIT: 29.4 % — AB (ref 40.0–52.0)
HEMOGLOBIN: 9.8 g/dL — AB (ref 13.0–18.0)
MCH: 31.1 pg (ref 26.0–34.0)
MCHC: 33.3 g/dL (ref 32.0–36.0)
MCV: 93.4 fL (ref 80.0–100.0)
Platelets: 157 10*3/uL (ref 150–440)
RBC: 3.15 MIL/uL — ABNORMAL LOW (ref 4.40–5.90)
RDW: 15.8 % — ABNORMAL HIGH (ref 11.5–14.5)
WBC: 6.5 10*3/uL (ref 3.8–10.6)

## 2017-01-26 MED ORDER — MORPHINE SULFATE (PF) 2 MG/ML IV SOLN
1.0000 mg | INTRAVENOUS | Status: DC | PRN
  Administered 2017-01-26 – 2017-01-27 (×4): 1 mg via INTRAVENOUS
  Filled 2017-01-26 (×4): qty 1

## 2017-01-26 MED ORDER — LORAZEPAM 2 MG/ML IJ SOLN
0.5000 mg | Freq: Four times a day (QID) | INTRAMUSCULAR | Status: DC | PRN
  Administered 2017-01-26 – 2017-01-27 (×2): 0.5 mg via INTRAVENOUS
  Filled 2017-01-26 (×2): qty 1

## 2017-01-26 NOTE — Clinical Social Work Note (Signed)
CSW aware via chart review that [facility]. CSW will assess when able.  Argentina PonderKaren Martha Lavel Rieman, MSW, Theresia MajorsLCSWA [phone]

## 2017-01-26 NOTE — Progress Notes (Signed)
Spoke with Dr. Tobi BastosPyreddy early this morning about patient's NGT had slid out to the 15 cm mark and that we needed to slide it back in to the 60 cm mark. I also asked Dr.Pyreddy for an order to check placement of NGT after we had inserted it back in to the 60 cm mark. Anselm Junglingonyers,Blair Mesina M

## 2017-01-26 NOTE — Progress Notes (Signed)
OT Cancellation Note  Patient Details Name: Derek Hancock MRN: [id number] DOB: 1921-06-10   Cancelled Treatment:      Family at beside and report that patient is not doing well this date.  Will hold OT evaluation and recheck patient status and reattempt as appropriate.    Amy T Lovett, OTR/L, CLT  Lovett,Amy 01/26/2017, 11:38 AM

## 2017-01-26 NOTE — Progress Notes (Signed)
[facility] [place] [facility]   PATIENT NAME: Derek Hancock    MR#:  [id number]  DATE OF BIRTH:  [birth year]  SUBJECTIVE:   Patient is unresponsive nonverbal. He was brought in with feculent-like vomiting with significant amount of stool noted on the CT abdomen.  NG tube was placed had a significant amount of bilious return. Found to have bilateral pneumonia right more than left. Family in the room REVIEW OF SYSTEMS:   Review of Systems  Unable to perform ROS: Patient unresponsive      DRUG ALLERGIES:  No Known Allergies  VITALS:  Blood pressure 122/62, pulse 90, temperature 98.8 F (37.1 C), resp. rate 17, height 5\' 7"  (1.702 m), weight 67.6 kg (149 lb), SpO2 96 %.  PHYSICAL EXAMINATION:   Physical Exam Limited exam GENERAL:  83 y.o.-year-old patient lying in the bed with no acute distress.  Critically ill HEENT: Head atraumatic, normocephalic. Oropharynx and nasopharynx clear.  NG tube with bilious return  LUNGS: Normal breath sounds bilaterally, no wheezing, rales, rhonchi. No use of accessory muscles of respiration.  CARDIOVASCULAR: S1, S2 normal. No murmurs, rubs, or gallops.  ABDOMEN: Soft, nontender, distended. EXTREMITIES: No cyanosis, clubbing or edema b/l.    NEUROLOGIC: unAble to assess secondary to patient being unresponsive PSYCHIATRIC: Unresponsive   LABORATORY PANEL:  CBC Recent Labs  Lab 01/26/17 0633  WBC 6.5  HGB 9.8*  HCT 29.4*  PLT 157    Chemistries  Recent Labs  Lab 01/25/17 0844 01/26/17 0633  NA 138 141  K 3.8 3.9  CL 99* 109  CO2 24 24  GLUCOSE 173* 94  BUN 32* 43*  CREATININE 1.26* 1.34*  CALCIUM 9.3 7.8*  AST 29  --   ALT 14*  --   ALKPHOS 80  --   BILITOT 1.3*  --    Cardiac Enzymes Recent Labs  Lab 01/25/17 0844  TROPONINI 0.03*   RADIOLOGY:  Dg Chest 1 View  Result Date: 01/25/2017 CLINICAL DATA:  Vomiting possibly dark brown stool. Right-sided abdominal pain previous hernia in  that region. EXAM: CHEST 1 VIEW COMPARISON:  None. FINDINGS: Lungs are somewhat hypoinflated with bibasilar opacification possibly multifocal pneumonia. No definite effusion. Cardiomediastinal silhouette is within normal. Air under the left hemidiaphragm which may be intraluminal or possible free peritoneal air. Remainder of the exam is unremarkable. IMPRESSION: Bibasilar patchy airspace process likely pneumonia. Air under the left hemidiaphragm which could represent free peritoneal air. Suggest further evaluation with upright PA and lateral chest radiograph with possible decubitus film or abdominal/pelvic CT. These results were called by telephone at the time of interpretation on 01/25/2017 at 9:46 am to Dr. Daryel November , who verbally acknowledged these results. Electronically Signed   By: Elberta Fortis M.D.   On: 01/25/2017 09:46   Ct Abdomen Pelvis W Contrast  Result Date: 01/25/2017 CLINICAL DATA:  83 year old presenting from the nursing home with 1 episode of possible feculent vomiting. Known right inguinal hernia with recent pain in the right inguinal region. Fever up to 100.8 degrees Fahrenheit. Surgical history includes unspecified hernia repair in 1970. EXAM: CT ABDOMEN AND PELVIS WITH CONTRAST TECHNIQUE: Multidetector CT imaging of the abdomen and pelvis was performed using the standard protocol following bolus administration of intravenous contrast. CONTRAST:  75mL ISOVUE-300 IOPAMIDOL INJECTION 61% IV. COMPARISON:  None. FINDINGS: Lower chest: Patchy airspace opacities throughout the visualized lung bases bilaterally, right greater than left. Apparent nodular opacities in the right middle lobe and lingula are likely  nodular acinar airspace opacities. No pleural effusions. Mild cardiomegaly. Severe aortic valvular calcification. Mitral annular calcification. No pericardial effusion. No visible coronary atherosclerosis. Hepatobiliary: Approximate 2.2 x 3.1 x 3.5 cm mass involving the medial  segment left lobe of liver at the dome (series 2, image 22 and coronal image 13), demonstrating nodular peripheral enhancement on the immediate images with further enhancement on the delayed images. No other focal hepatic parenchymal abnormality. Gallbladder normal in appearance without calcified gallstones. No biliary ductal dilation. Pancreas: Moderate to severe diffuse atrophy. No mass or peripancreatic inflammation. Spleen: Normal in size and appearance. Adrenals/Urinary Tract: Normal appearing adrenal glands. No urinary tract calculi or obstruction on either side. Mild diffuse cortical thinning involving both kidneys consistent with age. Small cortical cysts in both kidneys. No suspicious solid renal masses. No hydronephrosis. Urinary bladder decompressed and unremarkable. Stomach/Bowel: Stomach distended with fluid. Numerous dilated loops of small bowel throughout the abdomen and pelvis. Right inguinal hernia containing small bowel, right inguinal hernia containing a dilated loop of small bowel, with fluid in the hernia sac adjacent to the dilated loop. The small bowel distal to the hernia is decompressed. Diffuse colonic diverticulosis without evidence of acute diverticulitis. Moderate diffuse colonic stool burden and large stool burden in the rectum. Normal appendix in the right upper pelvis. Vascular/Lymphatic: Moderate to severe aortoiliofemoral atherosclerosis without evidence of aneurysm. Normal-appearing portal venous and systemic venous systems. No pathologic lymphadenopathy. Reproductive: Moderate to marked prostate gland enlargement, particularly the median lobe. Normal seminal vesicles. Other: None. Musculoskeletal: Multilevel degenerative disc disease, spondylosis and facet degenerative changes throughout the lower thoracic and lumbar spine, worst at L1-2 and L2-3. Osseous demineralization. Thoracolumbar levoscoliosis. Benign bone island in the first left sacral ala. No acute osseous abnormality.  IMPRESSION: 1. Partial small bowel obstruction related to obstruction of a loop of bowel within a right inguinal hernia. No free intraperitoneal air. 2. Pneumonia involving the visualized lung bases bilaterally. Apparent nodules in the right middle lobe and lingula are felt to represent airspace disease rather than true nodules. A follow-up CT chest after treatment may be helpful to confirm resolution. 3. Diffuse colonic diverticulosis without evidence of acute diverticulitis. Moderate diffuse colonic stool burden and large rectal stool burden. 4. Benign hemangioma involving the medial segment left lobe of liver. 5. Mild cardiomegaly.  Severe aortic valvular calcification. 6. Pancreatic atrophy. 7. Moderate to marked prostate gland enlargement, particularly the median lobe. 8.  Aortic Atherosclerosis (ICD10-170.0) Electronically Signed   By: Hulan Saashomas  Lawrence M.D.   On: 01/25/2017 10:21   Dg Chest Portable 1 View  Result Date: 01/25/2017 CLINICAL DATA:  NG tube placement. EXAM: PORTABLE CHEST 1 VIEW COMPARISON:  No recent. FINDINGS: NG tube noted with tip below left hemidiaphragm. Cardiomegaly. Bilateral pulmonary infiltrates, right side greater than left. No pleural effusion or pneumothorax . IMPRESSION: 1. NG tube noted with tip below left hemidiaphragm. 2. Bilateral pulmonary infiltrates right side greater than left. Findings most consistent with pneumonia. Electronically Signed   By: Maisie Fushomas  Register   On: 01/25/2017 12:28   Dg Abd Portable 1v  Result Date: 01/26/2017 CLINICAL DATA:  Encounter for NG tube placement. EXAM: PORTABLE ABDOMEN - 1 VIEW COMPARISON:  CT yesterday. FINDINGS: Tip and side port of the enteric tube below the diaphragm in the stomach. Dilated small bowel is fluid-filled and not well seen radiographically. No free air. Presumed external artifact over the left abdomen. Excreted IV contrast within the urinary bladder. IMPRESSION: Tip and side port of the enteric tube below the  diaphragm in the stomach. Electronically Signed   By: Melanie  Ehinger M.D.   On: 01/26/2017 04:05   ASSESSMENT AND PLAN:  83 year old male bRubye Oakseing admitted for partial small bowel obstruction bilateral pneumonia  * Partial SBO - NGT suction, bowel rest, n.p.o. for now - Surgicalist consult appreciated.  Patient is a very poor candidate for any surgery at present. -Dr. Everlene Farrierpabon had a long conversation with patient's son and I had conversation with rest of the family this morning.  Patient carries a very poor prognosis given sepsis, aspiration pneumonia, incarcerated inguinal hernia and small bowel obstruction. -Family has requested after their discussion and patient be comfort care only.  Patient is a DNR/DNI -IV as needed morphine and Ativan.  *Bilateral pneumonia -IV Rocephin and Zithromax--- will DC as since patient now is comfort care  *Lactic acidosis -Received aggressive IV hydration and monitor   *Elevated troponin -Likely due to demand ischemia  Overall patient is critically sick and high risk for cardiorespiratory failure and multiorgan failure including death.  Family is well aware and were updated at bedside.  Poor prognosis   Case discussed with Care Management/Social Worker.   CODE STATUS:dnr DVT Prophylaxis: None patient is comfort care  TOTAL TIME TAKING CARE OF THIS PATIENT: *35* minutes.  >50% time spent on counselling and coordination of care   Note: This dictation was prepared with Dragon dictation along with smaller phrase technology. Any transcriptional errors that result from this process are unintentional.  Enedina FinnerSona Aliani Caccavale M.D on 01/26/2017 at 1:25 PM  Between 7am to 6pm - Pager - [phone]  After 6pm go to [email or web address] - Social research officer, governmentpassword [email or web address]  [facility]  Office  [phone]  CC: Primary care physician; Maple HudsonGilbert, Richard L Jr., MD

## 2017-01-26 NOTE — Progress Notes (Signed)
PT Cancellation Note  Patient Details Name: Derek GuthrieGeorge D Hancock MRN: [id number] DOB: 12/21/1921   Cancelled Treatment:    Reason Eval/Treat Not Completed: Other (comment)(hold per RN and OT note.) After performing chart review and speaking with RN, it was recommended that PT evaluation be held at this time, as patient is likely transitioning to palliative care. Will check back at later date to determine if appropriate.   Neita CarpJulie Ann Imogine Carvell, PT, DPT, COMT 01/26/2017, 11:51 AM

## 2017-01-26 NOTE — Progress Notes (Signed)
Subjective: Advance dementia. NGT in place' Family thinking about comfort/palliative care  Objective: Vital signs in last 24 hours: Temp:  [97.9 F (36.6 C)-98.8 F (37.1 C)] 98.8 F (37.1 C) (11/24 0600) Pulse Rate:  [72-96] 90 (11/24 0600) Resp:  [17-23] 17 (11/24 0600) BP: (86-139)/(38-89) 122/62 (11/24 1015) SpO2:  [96 %-98 %] 96 % (11/23 1521) Last BM Date: 01/26/17  Intake/Output from previous day: 11/23 0701 - 11/24 0700 In: 2360.4 [I.V.:1560.4; IV Piggyback:800] Out: 700 [Urine:100; Emesis/NG output:600] Intake/Output this shift: Total I/O In: 621 [I.V.:621] Out: 100 [Emesis/NG output:100]  Physical exam:  Debilitated, confused Abd: soft, no peritonitis Ext: well perfused and warm  Lab Results: CBC  Recent Labs    01/25/17 0844 01/26/17 0633  WBC 7.5 6.5  HGB 12.6* 9.8*  HCT 38.3* 29.4*  PLT 255 157   BMET Recent Labs    01/25/17 0844 01/26/17 0633  NA 138 141  K 3.8 3.9  CL 99* 109  CO2 24 24  GLUCOSE 173* 94  BUN 32* 43*  CREATININE 1.26* 1.34*  CALCIUM 9.3 7.8*   PT/INR No results for input(s): LABPROT, INR in the last 72 hours. ABG No results for input(s): PHART, HCO3 in the last 72 hours.  Invalid input(s): PCO2, PO2  Studies/Results: Dg Chest 1 View  Result Date: 01/25/2017 CLINICAL DATA:  Vomiting possibly dark brown stool. Right-sided abdominal pain previous hernia in that region. EXAM: CHEST 1 VIEW COMPARISON:  None. FINDINGS: Lungs are somewhat hypoinflated with bibasilar opacification possibly multifocal pneumonia. No definite effusion. Cardiomediastinal silhouette is within normal. Air under the left hemidiaphragm which may be intraluminal or possible free peritoneal air. Remainder of the exam is unremarkable. IMPRESSION: Bibasilar patchy airspace process likely pneumonia. Air under the left hemidiaphragm which could represent free peritoneal air. Suggest further evaluation with upright PA and lateral chest radiograph with  possible decubitus film or abdominal/pelvic CT. These results were called by telephone at the time of interpretation on 01/25/2017 at 9:46 am to Dr. Daryel NovemberJONATHAN WILLIAMS , who verbally acknowledged these results. Electronically Signed   By: Elberta Fortisaniel  Boyle M.D.   On: 01/25/2017 09:46   Ct Abdomen Pelvis W Contrast  Result Date: 01/25/2017 CLINICAL DATA:  83 year old presenting from the nursing home with 1 episode of possible feculent vomiting. Known right inguinal hernia with recent pain in the right inguinal region. Fever up to 100.8 degrees Fahrenheit. Surgical history includes unspecified hernia repair in 1970. EXAM: CT ABDOMEN AND PELVIS WITH CONTRAST TECHNIQUE: Multidetector CT imaging of the abdomen and pelvis was performed using the standard protocol following bolus administration of intravenous contrast. CONTRAST:  75mL ISOVUE-300 IOPAMIDOL INJECTION 61% IV. COMPARISON:  None. FINDINGS: Lower chest: Patchy airspace opacities throughout the visualized lung bases bilaterally, right greater than left. Apparent nodular opacities in the right middle lobe and lingula are likely nodular acinar airspace opacities. No pleural effusions. Mild cardiomegaly. Severe aortic valvular calcification. Mitral annular calcification. No pericardial effusion. No visible coronary atherosclerosis. Hepatobiliary: Approximate 2.2 x 3.1 x 3.5 cm mass involving the medial segment left lobe of liver at the dome (series 2, image 22 and coronal image 13), demonstrating nodular peripheral enhancement on the immediate images with further enhancement on the delayed images. No other focal hepatic parenchymal abnormality. Gallbladder normal in appearance without calcified gallstones. No biliary ductal dilation. Pancreas: Moderate to severe diffuse atrophy. No mass or peripancreatic inflammation. Spleen: Normal in size and appearance. Adrenals/Urinary Tract: Normal appearing adrenal glands. No urinary tract calculi or obstruction on either  side. Mild diffuse cortical thinning involving both kidneys consistent with age. Small cortical cysts in both kidneys. No suspicious solid renal masses. No hydronephrosis. Urinary bladder decompressed and unremarkable. Stomach/Bowel: Stomach distended with fluid. Numerous dilated loops of small bowel throughout the abdomen and pelvis. Right inguinal hernia containing small bowel, right inguinal hernia containing a dilated loop of small bowel, with fluid in the hernia sac adjacent to the dilated loop. The small bowel distal to the hernia is decompressed. Diffuse colonic diverticulosis without evidence of acute diverticulitis. Moderate diffuse colonic stool burden and large stool burden in the rectum. Normal appendix in the right upper pelvis. Vascular/Lymphatic: Moderate to severe aortoiliofemoral atherosclerosis without evidence of aneurysm. Normal-appearing portal venous and systemic venous systems. No pathologic lymphadenopathy. Reproductive: Moderate to marked prostate gland enlargement, particularly the median lobe. Normal seminal vesicles. Other: None. Musculoskeletal: Multilevel degenerative disc disease, spondylosis and facet degenerative changes throughout the lower thoracic and lumbar spine, worst at L1-2 and L2-3. Osseous demineralization. Thoracolumbar levoscoliosis. Benign bone island in the first left sacral ala. No acute osseous abnormality. IMPRESSION: 1. Partial small bowel obstruction related to obstruction of a loop of bowel within a right inguinal hernia. No free intraperitoneal air. 2. Pneumonia involving the visualized lung bases bilaterally. Apparent nodules in the right middle lobe and lingula are felt to represent airspace disease rather than true nodules. A follow-up CT chest after treatment may be helpful to confirm resolution. 3. Diffuse colonic diverticulosis without evidence of acute diverticulitis. Moderate diffuse colonic stool burden and large rectal stool burden. 4. Benign hemangioma  involving the medial segment left lobe of liver. 5. Mild cardiomegaly.  Severe aortic valvular calcification. 6. Pancreatic atrophy. 7. Moderate to marked prostate gland enlargement, particularly the median lobe. 8.  Aortic Atherosclerosis (ICD10-170.0) Electronically Signed   By: Hulan Saas M.D.   On: 01/25/2017 10:21   Dg Chest Portable 1 View  Result Date: 01/25/2017 CLINICAL DATA:  NG tube placement. EXAM: PORTABLE CHEST 1 VIEW COMPARISON:  No recent. FINDINGS: NG tube noted with tip below left hemidiaphragm. Cardiomegaly. Bilateral pulmonary infiltrates, right side greater than left. No pleural effusion or pneumothorax . IMPRESSION: 1. NG tube noted with tip below left hemidiaphragm. 2. Bilateral pulmonary infiltrates right side greater than left. Findings most consistent with pneumonia. Electronically Signed   By: Maisie Fus  Register   On: 01/25/2017 12:28   Dg Abd Portable 1v  Result Date: 01/26/2017 CLINICAL DATA:  Encounter for NG tube placement. EXAM: PORTABLE ABDOMEN - 1 VIEW COMPARISON:  CT yesterday. FINDINGS: Tip and side port of the enteric tube below the diaphragm in the stomach. Dilated small bowel is fluid-filled and not well seen radiographically. No free air. Presumed external artifact over the left abdomen. Excreted IV contrast within the urinary bladder. IMPRESSION: Tip and side port of the enteric tube below the diaphragm in the stomach. Electronically Signed   By: Rubye Oaks M.D.   On: 01/26/2017 04:05    Anti-infectives: Anti-infectives (From admission, onward)   Start     Dose/Rate Route Frequency Ordered Stop   01/25/17 1700  cefTRIAXone (ROCEPHIN) 1 g in dextrose 5 % 50 mL IVPB     1 g 100 mL/hr over 30 Minutes Intravenous Every 24 hours 01/25/17 1612 2017-02-04 1659   01/25/17 1700  azithromycin (ZITHROMAX) tablet 500 mg     500 mg Oral Every 24 hours 01/25/17 1612 04-Feb-2017 1659   01/25/17 1030  vancomycin (VANCOCIN) IVPB 1000 mg/200 mL premix     1,000  mg 200 mL/hr over 60 Minutes Intravenous  Once 01/25/17 1029 01/25/17 1257   01/25/17 1030  piperacillin-tazobactam (ZOSYN) IVPB 3.375 g     3.375 g 100 mL/hr over 30 Minutes Intravenous  Once 01/25/17 1029 01/25/17 1257      Assessment/Plan:  Resolving SBO from resolving incarcerated Ing hernia Likely palliative care We will be available Derek Hancock Derek Reinertsen, MD, FACS  01/26/2017

## 2017-01-26 NOTE — Clinical Social Work Note (Signed)
Clinical Social Work Assessment  Patient Details  Name: Derek Hancock MRN: [id number] Date of Birth: Nov 15, 1921  Date of referral:  01/26/17               Reason for consult:  Facility Placement                Permission sought to share information with:  Facility Art therapist granted to share information::  Yes, Verbal Permission Granted  Name::        Agency::     Relationship::     Contact Information:     Housing/Transportation Living arrangements for the past 2 months:  [place] of Information:  Adult Children, Medical Team Patient Interpreter Needed:  None Criminal Activity/Legal Involvement Pertinent to Current Situation/Hospitalization:  No - Comment as needed Significant Relationships:  Church, Warehouse manager, Other Family Members, Adult Children Lives with:  Facility Resident Do you feel safe going back to the place where you live?  Yes Need for family participation in patient care:  Yes (Comment)(Patient currently non-verbal)  Care giving concerns: Patient admitted from [name] imminent   Social Worker assessment / plan:  CSW met with the patient's family at bedside to discuss goals of care. The family confirmed that the patient admitted from [facility] and that they had decided to pursue comfort care only at this time. The CSW provided emotional support and invited the patient's family to ask any questions regarding end-of-life including questions about hospice at home or in the hospice facility. The family reported that they have no current questions; the CSW invited the family to contact should any arise.   The patient will most likely expire in the hospital. CSW will follow pending change in disposition and possible stability to transfer to the hospice facility should they have an available bed. Employment status:  Retired Nurse, adult PT Recommendations:  Not assessed at  this time Information / Referral to community resources:     Patient/Family's Response to care:  The patient was somnolent. The family thanked the CSW for assistance.  Patient/Family's Understanding of and Emotional Response to Diagnosis, Current Treatment, and Prognosis:  The family understands and agrees with the end-of-life goals. The family is aware of their ability to contact CSW should they have questions or concerns, or should they need emotional support.  Emotional Assessment Appearance:  Appears stated age Attitude/Demeanor/Rapport:  Lethargic Affect (typically observed):  Withdrawn Orientation:  (Patient was somnulent ) Alcohol / Substance use:  Never Used Psych involvement (Current and /or in the community):  No (Comment)  Discharge Needs  Concerns to be addressed:  Care Coordination, Grief and Loss Concerns Readmission within the last 30 days:  No Current discharge risk:  Terminally ill Barriers to Discharge:  Continued Medical Work up, Hospice Bed not available   Derek Pho, LCSW 01/26/2017, 5:08 PM

## 2017-01-27 MED ORDER — MORPHINE SULFATE (PF) 2 MG/ML IV SOLN
2.0000 mg | INTRAVENOUS | Status: DC | PRN
  Administered 2017-01-27 – 2017-01-28 (×9): 2 mg via INTRAVENOUS
  Filled 2017-01-27 (×9): qty 1

## 2017-01-27 NOTE — Progress Notes (Signed)
[facility] [place] [facility]   PATIENT NAME: Derek Hancock    MR#:  [id number]  DATE OF BIRTH:  04/06/1921  SUBJECTIVE:   Patient is unresponsive nonverbal. He was brought in with feculent-like vomiting with significant amount of stool noted on the CT abdomen.  NG tube was placed had a significant amount of bilious return. Found to have bilateral pneumonia right more than left. Family in the room REVIEW OF SYSTEMS:   Review of Systems  Unable to perform ROS: Patient unresponsive      DRUG ALLERGIES:  No Known Allergies  VITALS:  Blood pressure 134/82, pulse 78, temperature 97.9 F (36.6 C), temperature source Oral, resp. rate 18, height 5\' 7"  (1.702 m), weight 67.6 kg (149 lb), SpO2 96 %.  PHYSICAL EXAMINATION:   Physical Exam Limited exam GENERAL:  83 y.o.-year-old patient lying in the bed with no acute distress.  Critically ill HEENT: Head atraumatic, normocephalic. Oropharynx and nasopharynx clear.  NG tube with bilious return  LUNGS: Normal breath sounds bilaterally, no wheezing, rales, rhonchi. No use of accessory muscles of respiration.  CARDIOVASCULAR: S1, S2 normal. No murmurs, rubs, or gallops.  ABDOMEN: Soft, nontender, distended. EXTREMITIES: No cyanosis, clubbing or edema b/l.    NEUROLOGIC: unAble to assess secondary to patient being unresponsive PSYCHIATRIC: Unresponsive   LABORATORY PANEL:  CBC Recent Labs  Lab 01/26/17 0633  WBC 6.5  HGB 9.8*  HCT 29.4*  PLT 157    Chemistries  Recent Labs  Lab 01/25/17 0844 01/26/17 0633  NA 138 141  K 3.8 3.9  CL 99* 109  CO2 24 24  GLUCOSE 173* 94  BUN 32* 43*  CREATININE 1.26* 1.34*  CALCIUM 9.3 7.8*  AST 29  --   ALT 14*  --   ALKPHOS 80  --   BILITOT 1.3*  --    Cardiac Enzymes Recent Labs  Lab 01/25/17 0844  TROPONINI 0.03*   RADIOLOGY:  Dg Chest Portable 1 View  Result Date: 01/25/2017 CLINICAL DATA:  NG tube placement. EXAM: PORTABLE CHEST 1 VIEW  COMPARISON:  No recent. FINDINGS: NG tube noted with tip below left hemidiaphragm. Cardiomegaly. Bilateral pulmonary infiltrates, right side greater than left. No pleural effusion or pneumothorax . IMPRESSION: 1. NG tube noted with tip below left hemidiaphragm. 2. Bilateral pulmonary infiltrates right side greater than left. Findings most consistent with pneumonia. Electronically Signed   By: Maisie Fushomas  Register   On: 01/25/2017 12:28   Dg Abd Portable 1v  Result Date: 01/26/2017 CLINICAL DATA:  Encounter for NG tube placement. EXAM: PORTABLE ABDOMEN - 1 VIEW COMPARISON:  CT yesterday. FINDINGS: Tip and side port of the enteric tube below the diaphragm in the stomach. Dilated small bowel is fluid-filled and not well seen radiographically. No free air. Presumed external artifact over the left abdomen. Excreted IV contrast within the urinary bladder. IMPRESSION: Tip and side port of the enteric tube below the diaphragm in the stomach. Electronically Signed   By: Rubye OaksMelanie  Ehinger M.D.   On: 01/26/2017 04:05   ASSESSMENT AND PLAN:  83 year old male being admitted for partial small bowel obstruction bilateral pneumonia  * Partial SBO - NGT suction, bowel rest, n.p.o. for now - Surgicalist consult appreciated.  Patient is a very poor candidate for any surgery at present. -Dr. Everlene Farrierpabon had a long conversation with patient's son and I had conversation with rest of the family this morning.  Patient carries a very poor prognosis given sepsis, aspiration pneumonia, incarcerated inguinal  hernia and small bowel obstruction. -Family has requested after their discussion and patient be comfort care only.  Patient is a DNR/DNI -IV as needed morphine and Ativan.  *Bilateral pneumonia -IV Rocephin and Zithromax--- will DC as since patient now is comfort care  *Lactic acidosis -Received aggressive IV hydration and monitor   *Elevated troponin -Likely due to demand ischemia  Overall patient is critically sick  and high risk for cardiorespiratory failure and multiorgan failure including death.  Family is well aware and were updated at bedside.  Poor prognosis   Case discussed with Care Management/Social Worker.   CODE STATUS:dnr DVT Prophylaxis: None patient is comfort care  TOTAL TIME TAKING CARE OF THIS PATIENT: *35* minutes.  >50% time spent on counselling and coordination of care   Note: This dictation was prepared with Dragon dictation along with smaller phrase technology. Any transcriptional errors that result from this process are unintentional.  Enedina FinnerSona Avaiah Stempel M.D on 01/27/2017 at 10:26 AM  Between 7am to 6pm - Pager - [phone]  After 6pm go to [email or web address] - Social research officer, governmentpassword [email or web address] [email or web address]  [facility]  Office  [phone]  CC: Primary care physician; Maple HudsonGilbert, Richard L Jr., MD

## 2017-01-28 LAB — HIV ANTIBODY (ROUTINE TESTING W REFLEX): HIV Screen 4th Generation wRfx: NONREACTIVE

## 2017-01-28 MED ORDER — MORPHINE SULFATE (PF) 2 MG/ML IV SOLN
2.0000 mg | INTRAVENOUS | Status: DC | PRN
  Administered 2017-01-28 (×4): 2 mg via INTRAVENOUS
  Filled 2017-01-28 (×4): qty 1

## 2017-01-28 MED ORDER — MORPHINE SULFATE (CONCENTRATE) 10 MG/0.5ML PO SOLN: 10.0000 mg | ORAL | 0 refills | Status: AC | PRN

## 2017-01-28 MED ORDER — MORPHINE SULFATE (CONCENTRATE) 10 MG/0.5ML PO SOLN: 10.0000 mg | ORAL | Status: DC | PRN

## 2017-01-28 NOTE — Progress Notes (Signed)
New hospice home referral received from Dr. Fritzi Mandes. Patient is and 83 year old man with a known history of Dementia, Mitral insufficiency, BPH, CAD, CKD II, Carotid artery stenosis and PVD and admitted to [facility] on 11/23 from [facility] for evaluation of several days of vomiting what appeared to be stool. Patient was found to have an incarcerated inguinal hernia, small bowel obstruction and bilateral pneumonia. He had an NG tube placed for decompression and had a bowel movement charted on 11/24. Not a surgical candidate. Patient has continued to decline. He is requiring frequent doses of IV morphine for control of pain and dyspnea.  Family has chosen to focus on comfort with transfer to the Hospice home. Writer met in the [place] with patient's son Sharlet [place], son Shanon Brow was on speaker phone. Education initiated regarding hospice services, philosophy and team approach to care with good understanding voiced. Questions answered, consents signed. Patient information faxed to referral. Report called to the hospice home. EMS notified for a 5:30 pm pick up. Signed DNR in place ion discharge packet. Family and hospital care team all aware. Thank you. Flo Shanks RN, BSN, [facility] hospital liaison [phone] c

## 2017-01-28 NOTE — Progress Notes (Signed)
Patient discharged to hospice home via ems

## 2017-01-28 NOTE — Progress Notes (Signed)
When I was pushing the IV morphine I did not notice the IV connector was loose and the saline and morphine has run down between the patient's finger.  Order received from Dr Allena KatzPatel to repeat the morphine dose

## 2017-01-28 NOTE — Discharge Summary (Signed)
[facility] - [place] [facility]   PATIENT NAME: Derek Hancock    MR#:  [id number]  DATE OF BIRTH:  [birth year]  DATE OF ADMISSION:  01/25/2017 ADMITTING PHYSICIAN: Delfino LovettVipul Shah, MD  DATE OF DISCHARGE: 01/28/17  PRIMARY CARE PHYSICIAN: Maple HudsonGilbert, Richard L Jr., MD    ADMISSION DIAGNOSIS:  Lactic acidosis [E87.2] Aspiration pneumonia of both lower lobes due to gastric secretions (HCC) [J69.0] Inguinal hernia of right side with obstruction [K40.30]  DISCHARGE DIAGNOSIS:  Severe sepsis Bilateral aspiration pneumonia Small bowel obstruction with incarcerated right-sided inguinal hernia Failure to thrive  SECONDARY DIAGNOSIS:   Past Medical History:  Diagnosis Date  . Dementia   . Elevated blood pressure     HOSPITAL COURSE:   83 year old male being admitted for partial small bowel obstruction bilateral pneumonia  *Partial SBO - NGT suction,bowel rest,n.p.o. for now - Surgicalist consult appreciated.  Patient is a very poor candidate for any surgery at present. -Dr. Everlene Farrierpabon had a long conversation with patient's son and I had conversation with rest of the family this morning.  Patient carries a very poor prognosis given sepsis, aspiration pneumonia, incarcerated inguinal hernia and small bowel obstruction. -Family has requested after their discussion and patient be comfort care only.  Patient is a DNR/DNI -IV as needed morphine and Ativan. -Hospice home transfer today  * sepsis due to Bilateral pneumonia -IV Rocephin and Zithromax--- will DC as since patient now is comfort care  *Lactic acidosis -Received aggressive IV hydration   Overall patient is critically sick and high risk for cardiorespiratory failure and multiorgan failure including death.Family is well aware and were updated at bedside.  Poor prognosis  D/c to hospice facility  CONSULTS OBTAINED:    DRUG ALLERGIES:  No Known Allergies  DISCHARGE MEDICATIONS:   Current  Discharge Medication List    START taking these medications   Details  Morphine Sulfate (MORPHINE CONCENTRATE) 10 MG/0.5ML SOLN concentrated solution Take 0.5 mLs (10 mg total) by mouth every 2 (two) hours as needed for severe pain. Qty: 180 mL, Refills: 0      CONTINUE these medications which have NOT CHANGED   Details  aspirin 81 MG tablet Take 81 mg by mouth daily.    Acetaminophen (TYLENOL PO) Take 1,000 mg by mouth 2 (two) times daily.       STOP taking these medications     citalopram (CELEXA) 10 MG tablet      ferrous sulfate 325 (65 FE) MG EC tablet      furosemide (LASIX) 20 MG tablet      loperamide (HM LOPERAMIDE HCL) 2 MG capsule      omeprazole (PRILOSEC) 20 MG capsule      potassium chloride (K-DUR,KLOR-CON) 10 MEQ tablet      hydrochlorothiazide (MICROZIDE) 12.5 MG capsule         If you experience worsening of your admission symptoms, develop shortness of breath, life threatening emergency, suicidal or homicidal thoughts you must seek medical attention immediately by calling 911 or calling your MD immediately  if symptoms less severe.  You Must read complete instructions/literature along with all the possible adverse reactions/side effects for all the Medicines you take and that have been prescribed to you. Take any new Medicines after you have completely understood and accept all the possible adverse reactions/side effects.   Please note  You were cared for by a hospitalist during your hospital stay. If you have any questions about your discharge medications or the care you  received while you were in the hospital after you are discharged, you can call the unit and asked to speak with the hospitalist on call if the hospitalist that took care of you is not available. Once you are discharged, your primary care physician will handle any further medical issues. Please note that NO REFILLS for any discharge medications will be authorized once you are discharged, as  it is imperative that you return to your primary care physician (or establish a relationship with a primary care physician if you do not have one) for your aftercare needs so that they can reassess your need for medications and monitor your lab values.   DATA REVIEW:   CBC  Recent Labs  Lab 01/26/17 0633  WBC 6.5  HGB 9.8*  HCT 29.4*  PLT 157    Chemistries  Recent Labs  Lab 01/25/17 0844 01/26/17 0633  NA 138 141  K 3.8 3.9  CL 99* 109  CO2 24 24  GLUCOSE 173* 94  BUN 32* 43*  CREATININE 1.26* 1.34*  CALCIUM 9.3 7.8*  AST 29  --   ALT 14*  --   ALKPHOS 80  --   BILITOT 1.3*  --     Microbiology Results   Recent Results (from the past 240 hour(s))  Blood culture (routine x 2)     Status: None (Preliminary result)   Collection Time: 01/25/17 11:10 AM  Result Value Ref Range Status   Specimen Description BLOOD Blood Culture adequate volume  Final   Special Requests BOTTLES DRAWN AEROBIC AND ANAEROBIC  Final   Culture NO GROWTH 3 DAYS  Final   Report Status PENDING  Incomplete  Blood culture (routine x 2)     Status: None (Preliminary result)   Collection Time: 01/25/17 11:21 AM  Result Value Ref Range Status   Specimen Description BLOOD Blood Culture adequate volume  Final   Special Requests BOTTLES DRAWN AEROBIC AND ANAEROBIC  Final   Culture NO GROWTH 3 DAYS  Final   Report Status PENDING  Incomplete  MRSA PCR Screening     Status: None   Collection Time: 01/25/17  6:14 PM  Result Value Ref Range Status   MRSA by PCR NEGATIVE NEGATIVE Final    Comment:        The GeneXpert MRSA Assay (FDA approved for NASAL specimens only), is one component of a comprehensive MRSA colonization surveillance program. It is not intended to diagnose MRSA infection nor to guide or monitor treatment for MRSA infections.     RADIOLOGY:  No results found.   Management plans discussed with the patient, family and they are in agreement.  CODE STATUS:     Code Status  Orders  (From admission, onward)        Start     Ordered   01/25/17 1613  Do not attempt resuscitation (DNR)  Continuous    Question Answer Comment  In the event of cardiac or respiratory ARREST Do not call a "code blue"   In the event of cardiac or respiratory ARREST Do not perform Intubation, CPR, defibrillation or ACLS   In the event of cardiac or respiratory ARREST Use medication by any route, position, wound care, and other measures to relive pain and suffering. May use oxygen, suction and manual treatment of airway obstruction as needed for comfort.      01/25/17 1612    Code Status History    Date Active Date Inactive Code Status Order ID Comments User Context  This patient has a current code status but no historical code status.    Advance Directive Documentation     Most Recent Value  Type of Advance Directive  Healthcare Power of Attorney, Living will  Pre-existing out of facility DNR order (yellow form or pink MOST form)  No data  "MOST" Form in Place?  No data      TOTAL TIME TAKING CARE OF THIS PATIENT: 40 minutes.    Enedina Finner M.D on 01/28/2017 at 3:15 PM  Between 7am to 6pm - Pager - [phone] After 6pm go to [email or web address] - Social research officer, government  [facility]  Office  [phone]  CC: Primary care physician; Maple Hudson., MD

## 2017-01-28 NOTE — Progress Notes (Addendum)
[facility] - [place] at [facility]   PATIENT NAME: Derek PinkGeorge Hancock    MR#:  [id number]  DATE OF BIRTH:  11-Jun-1921  SUBJECTIVE:   Patient is unresponsive nonverbal. Family in the room REVIEW OF SYSTEMS:   Review of Systems  Unable to perform ROS: Patient unresponsive      DRUG ALLERGIES:  No Known Allergies  VITALS:  Blood pressure (!) 87/54, pulse 90, temperature 98.5 F (36.9 C), temperature source Oral, resp. rate (!) 22, height 5\' 7"  (1.702 m), weight 67.6 kg (149 lb), SpO2 90 %.  PHYSICAL EXAMINATION:   Physical Exam Limited exam GENERAL:  83 y.o.-year-old patient lying in the bed with no acute distress.  Critically ill HEENT: Head atraumatic, normocephalic. Oropharynx and nasopharynx clear.  NG tube with bilious return  LUNGS: Normal breath sounds bilaterally, no wheezing, rales, rhonchi. No use of accessory muscles of respiration.  CARDIOVASCULAR: S1, S2 normal. No murmurs, rubs, or gallops.  ABDOMEN: Soft, nontender, distended. EXTREMITIES: No cyanosis, clubbing or edema b/l.    NEUROLOGIC: unAble to assess secondary to patient being unresponsive PSYCHIATRIC: Unresponsive   LABORATORY PANEL:  CBC Recent Labs  Lab 01/26/17 0633  WBC 6.5  HGB 9.8*  HCT 29.4*  PLT 157    Chemistries  Recent Labs  Lab 01/25/17 0844 01/26/17 0633  NA 138 141  K 3.8 3.9  CL 99* 109  CO2 24 24  GLUCOSE 173* 94  BUN 32* 43*  CREATININE 1.26* 1.34*  CALCIUM 9.3 7.8*  AST 29  --   ALT 14*  --   ALKPHOS 80  --   BILITOT 1.3*  --    Cardiac Enzymes Recent Labs  Lab 01/25/17 0844  TROPONINI 0.03*   RADIOLOGY:  No results found. ASSESSMENT AND PLAN:  83 year old male being admitted for partial small bowel obstruction bilateral pneumonia  * Partial SBO - NGT suction, bowel rest, n.p.o. for now - Surgicalist consult appreciated.  Patient is a very poor candidate for any surgery at present. -Dr. Everlene Farrierpabon had a long conversation with  patient's son and I had conversation with rest of the family this morning.  Patient carries a very poor prognosis given sepsis, aspiration pneumonia, incarcerated inguinal hernia and small bowel obstruction. -Family has requested after their discussion and patient be comfort care only.  Patient is a DNR/DNI -IV as needed morphine and Ativan. -Hospice home referral made  * sepsis due to Bilateral pneumonia -IV Rocephin and Zithromax--- will DC as since patient now is comfort care  *Lactic acidosis -Received aggressive IV hydration and monitor  Overall patient is critically sick and high risk for cardiorespiratory failure and multiorgan failure including death.  Family is well aware and were updated at bedside.  Poor prognosis   Case discussed with Care Management/Social Worker.   CODE STATUS:dnr DVT Prophylaxis: None patient is comfort care  TOTAL TIME TAKING CARE OF THIS PATIENT: *15* minutes.  >50% time spent on counselling and coordination of care   Note: This dictation was prepared with Dragon dictation along with smaller phrase technology. Any transcriptional errors that result from this process are unintentional.  Enedina FinnerSona Chynna Buerkle M.D on 01/28/2017 at 10:18 AM  Between 7am to 6pm - Pager - [phone]  After 6pm go to [email or web address] - Social research officer, governmentpassword EPAS [facility]  [facility] [place] [facility]  Office  [phone]  CC: Primary care physician; Maple HudsonGilbert, Richard L Jr., MD

## 2017-01-28 NOTE — Progress Notes (Signed)
Order received to change morphine to every 1 hour

## 2017-01-30 ENCOUNTER — Telehealth: Payer: Self-pay | Admitting: Family Medicine

## 2017-01-30 LAB — CULTURE, BLOOD (ROUTINE X 2)
CULTURE: NO GROWTH
Culture: NO GROWTH
Specimen Description: ADEQUATE
Specimen Description: ADEQUATE

## 2017-01-30 NOTE — Telephone Encounter (Signed)
Derek Hancock with Hospice wanted to let Dr. Sullivan LoneGilbert know that pt passed away last night 01/14/2017 @ 8:40 pm. Thanks TNP

## 2017-01-30 NOTE — Telephone Encounter (Signed)
FYI-Anastasiya V Hopkins, RMA  

## 2017-02-02 DEATH — deceased

## 2018-04-27 IMAGING — DX DG CHEST 1V PORT
1 series · 1 of 1 positions shown · non-contrast
Comparison: No recent.

CLINICAL DATA: NG tube placement.

EXAM:
PORTABLE CHEST 1 VIEW

[chest ap]
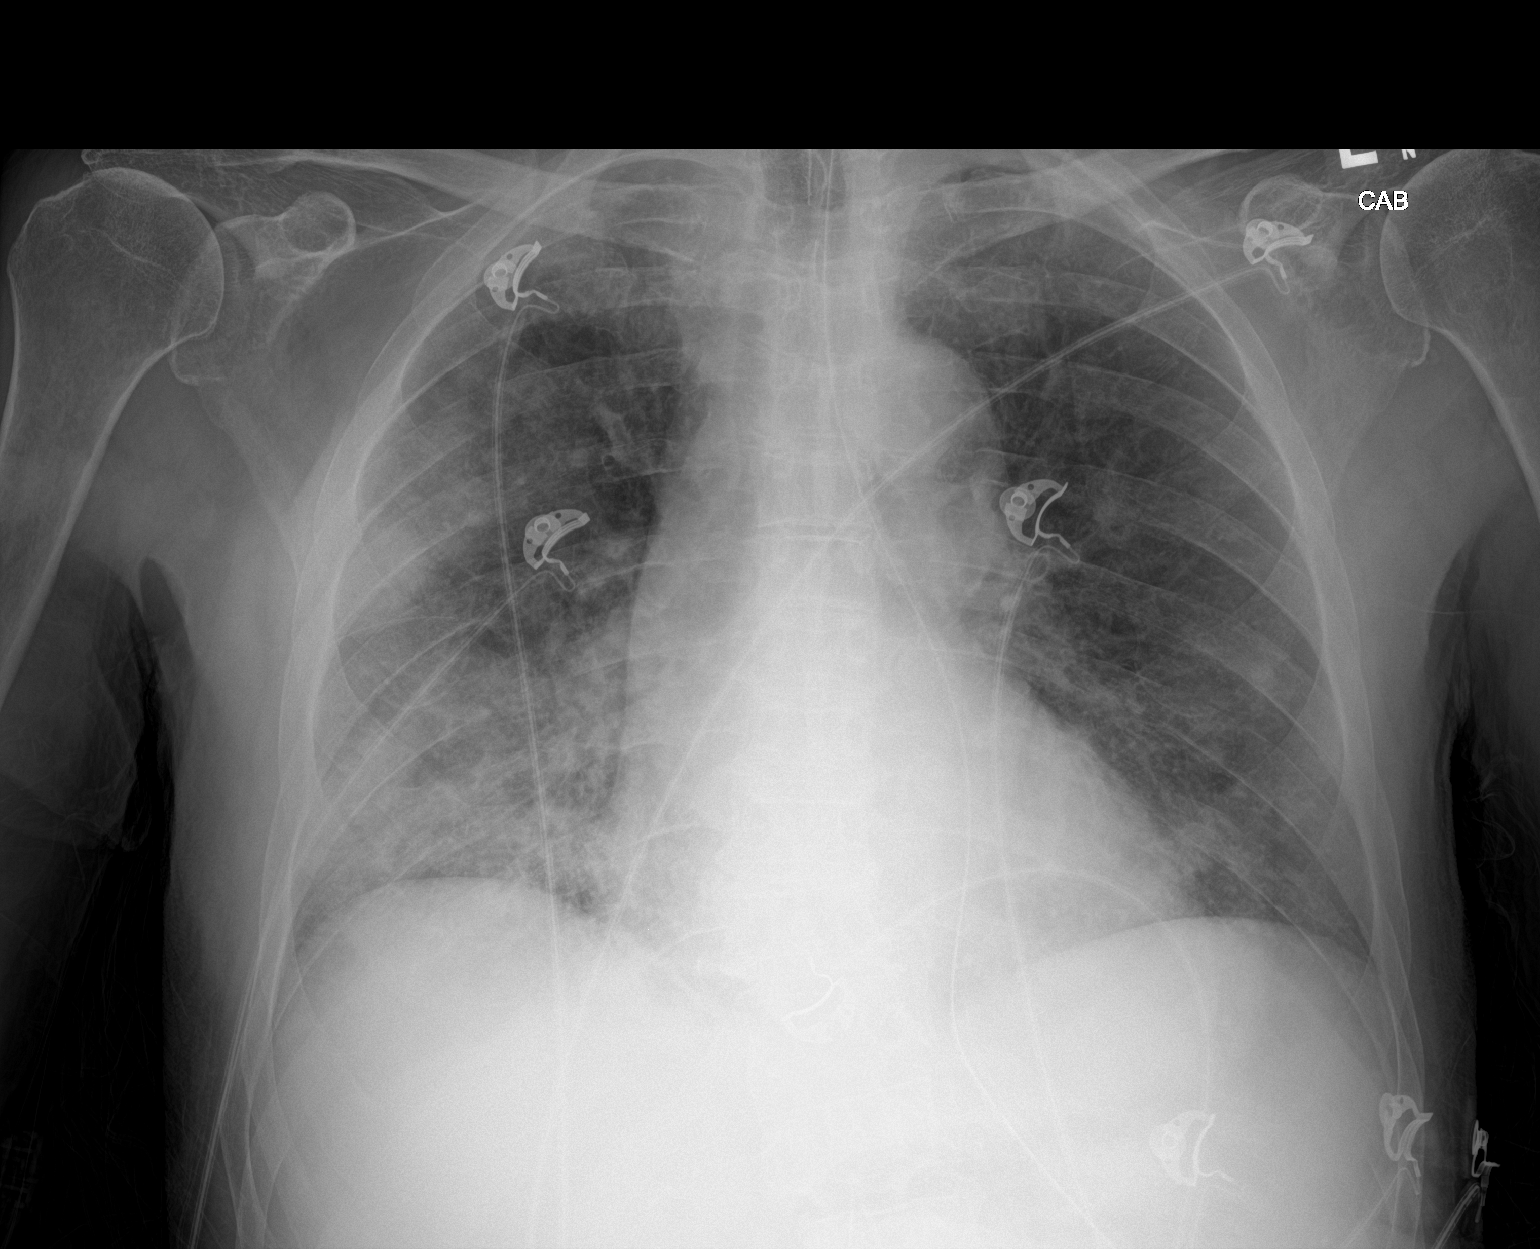

[1 of 1 positions shown; findings below may reference images not displayed]

FINDINGS: NG tube noted with tip below left hemidiaphragm. Cardiomegaly.
Bilateral pulmonary infiltrates, right side greater than left. No
pleural effusion or pneumothorax .
IMPRESSION: 1. NG tube noted with tip below left hemidiaphragm.

2. Bilateral pulmonary infiltrates right side greater than left.
Findings most consistent with pneumonia.

## 2018-04-27 IMAGING — CT CT ABD-PELV W/ CM
2 of 5 series · 14 of 46 positions shown, 16 images · IV contrast (iopamidol)
Comparison: None.

CLINICAL DATA: [AGE] presenting from the [HOSPITAL] with 1
episode of possible feculent vomiting. Known right inguinal hernia
with recent pain in the right inguinal region. Fever up to
degrees Fahrenheit. Surgical history includes unspecified hernia
repair in 7412.

EXAM:
CT ABDOMEN AND PELVIS WITH CONTRAST
TECHNIQUE: Multidetector CT imaging of the abdomen and pelvis was performed
using the standard protocol following bolus administration of
intravenous contrast.
CONTRAST:  75mL [id number] IOPAMIDOL INJECTION 61% IV.

[Series 2: routine abd/pel with · axial · 0.83mm/px · z∈[-408,-24]mm · 11 of 89 slices shown, 13 images]
[im 6/89  soft-tissue]
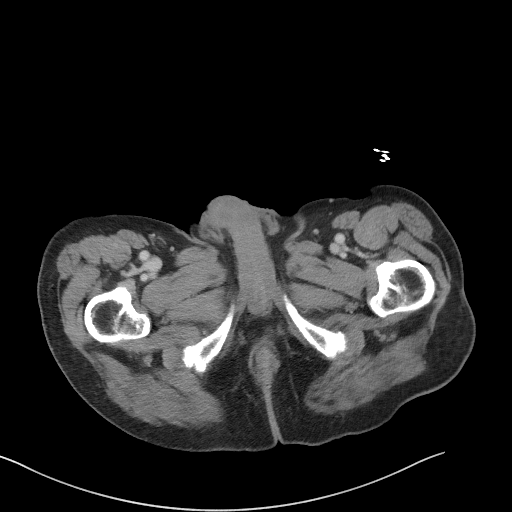
[im 6/89  bone]
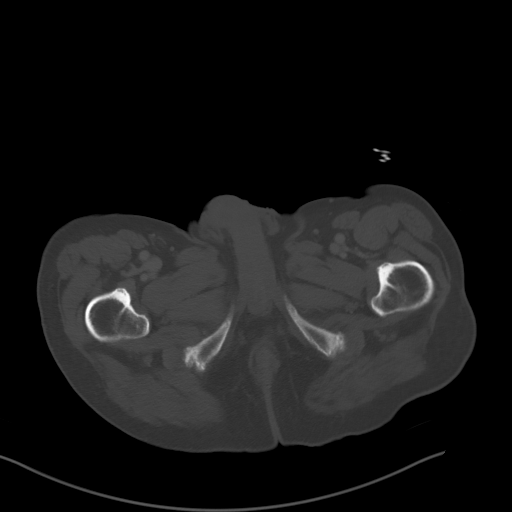
[im 16/89  soft-tissue]
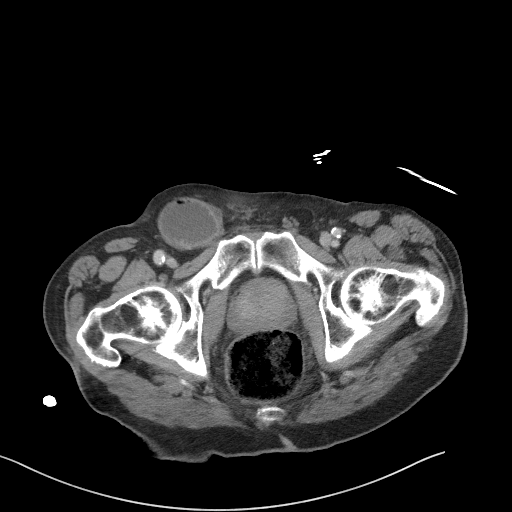
[im 21/89  soft-tissue]
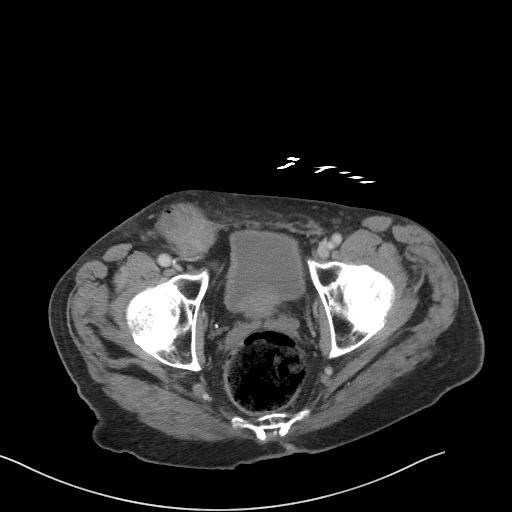
[im 32/89  soft-tissue]
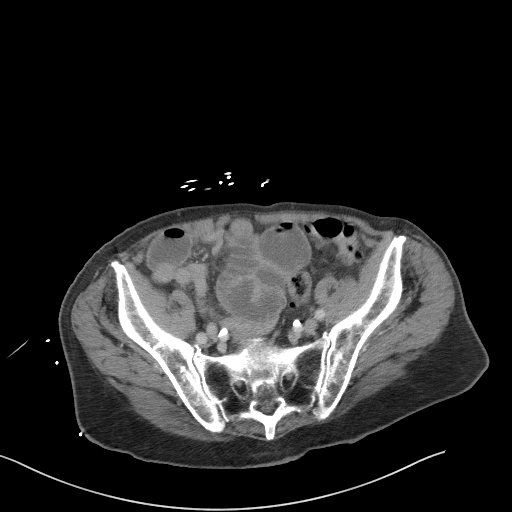
[im 37/89  soft-tissue]
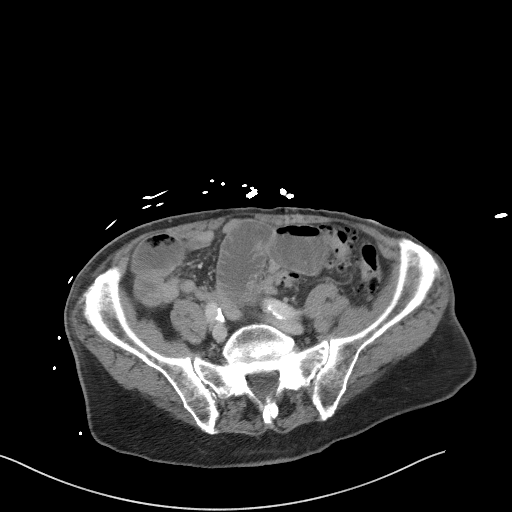
[im 47/89  soft-tissue]
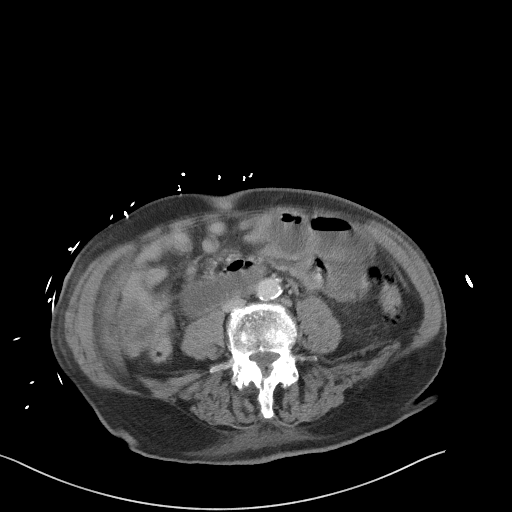
[im 52/89  soft-tissue]
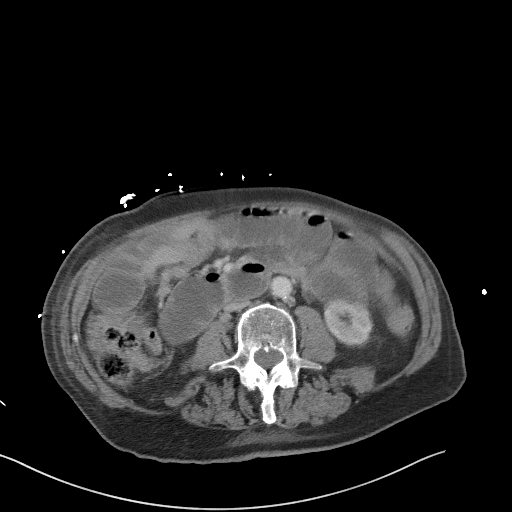
[im 57/89  soft-tissue]
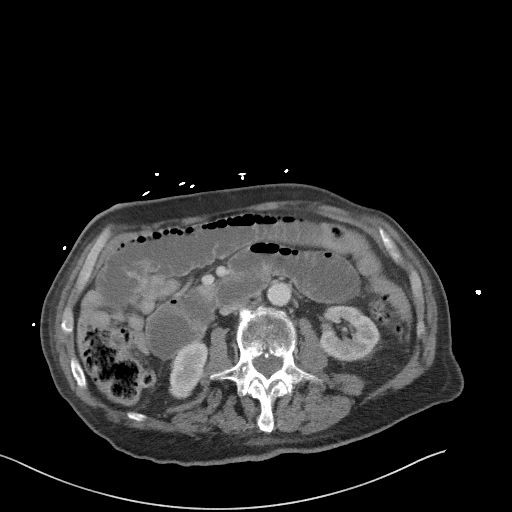
[im 68/89  soft-tissue]
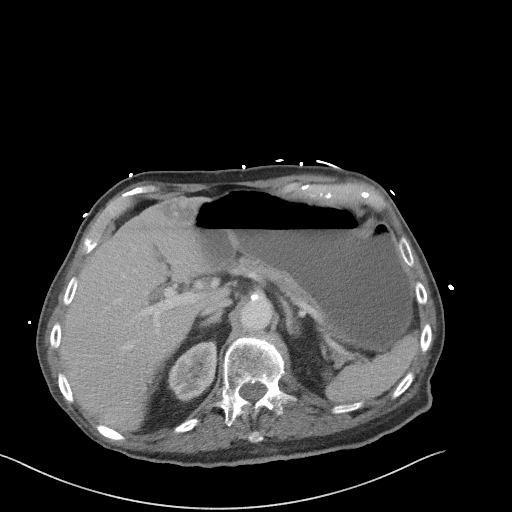
[im 68/89  bone]
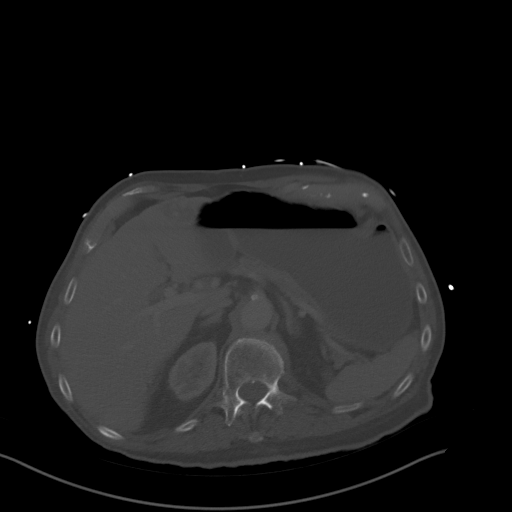
[im 73/89  soft-tissue]
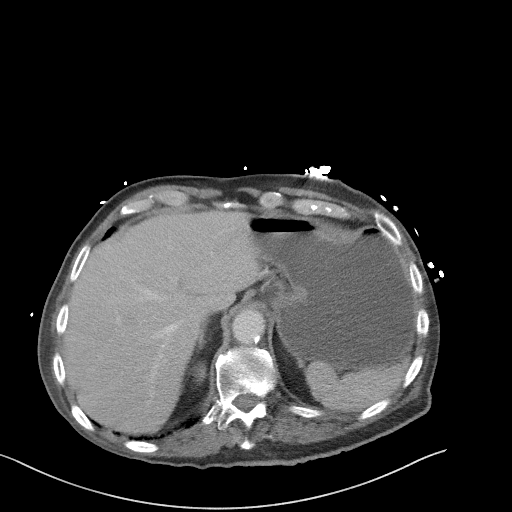
[im 83/89  soft-tissue]
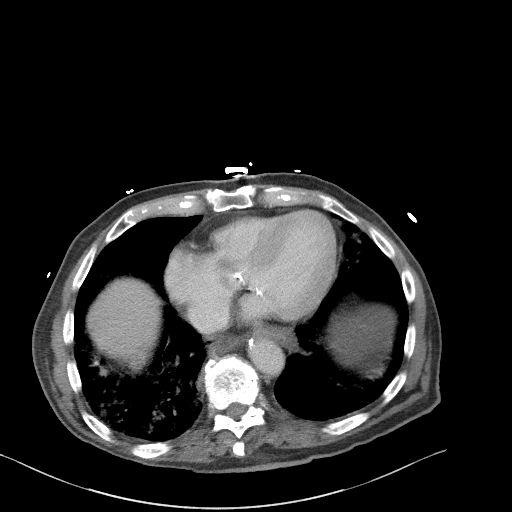

[Series 5: coronal st · coronal · 0.77mm/px · 3 of 83 slices shown]
[im 28/83  soft-tissue]
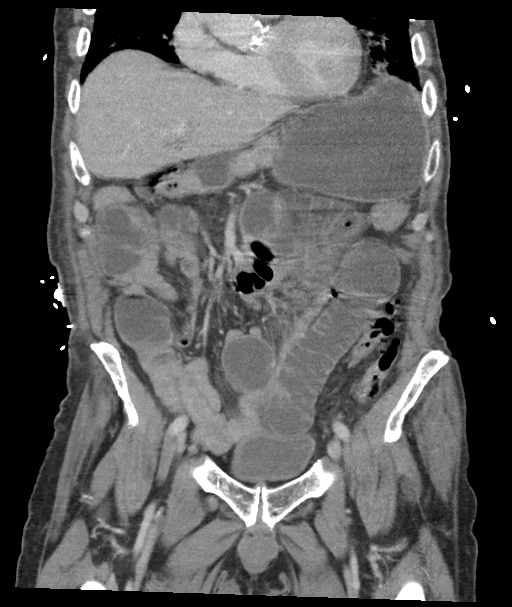
[im 37/83  soft-tissue]
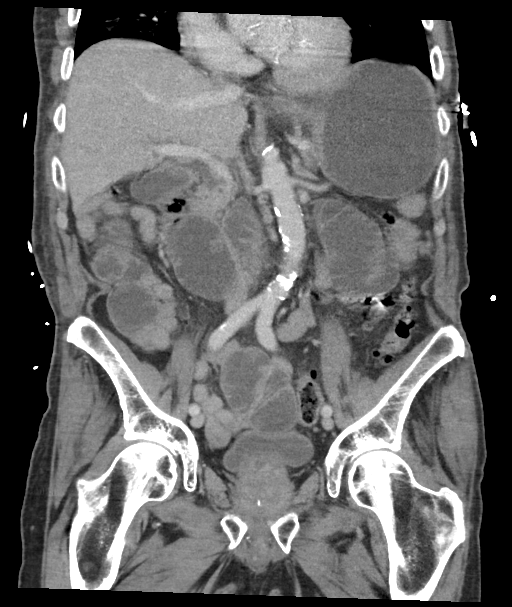
[im 46/83  soft-tissue]
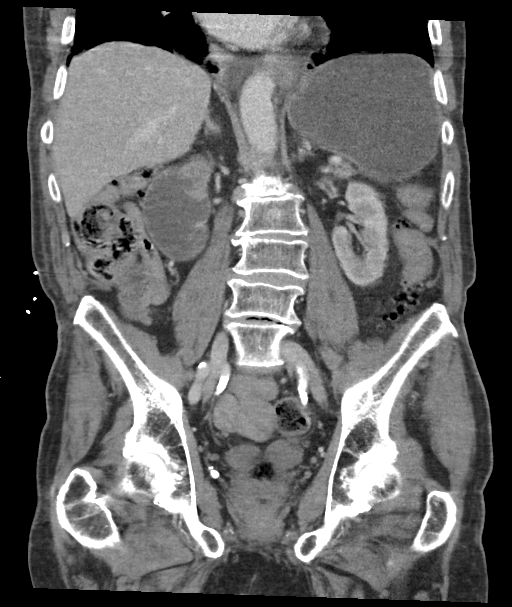

[14 of 46 positions shown; findings below may reference images not displayed]

FINDINGS: Lower chest: Patchy airspace opacities throughout the visualized
lung bases bilaterally, right greater than left. Apparent nodular
opacities in the right middle lobe and lingula are likely nodular
acinar airspace opacities. No pleural effusions.

Mild cardiomegaly. Severe aortic valvular calcification. Mitral
annular calcification. No pericardial effusion. No visible coronary
atherosclerosis.

Hepatobiliary: Approximate 2.2 x 3.1 x 3.5 cm mass involving the
medial segment left lobe of liver at the dome (series 2, image 22
and coronal image 13), demonstrating nodular peripheral enhancement
on the immediate images with further enhancement on the delayed
images. No other focal hepatic parenchymal abnormality.

Gallbladder normal in appearance without calcified gallstones. No
biliary ductal dilation.

Pancreas: Moderate to severe diffuse atrophy. No mass or
peripancreatic inflammation.

Spleen: Normal in size and appearance.

Adrenals/Urinary Tract: Normal appearing adrenal glands. No urinary
tract calculi or obstruction on either side. Mild diffuse cortical
thinning involving both kidneys consistent with age. Small cortical
cysts in both kidneys. No suspicious solid renal masses. No
hydronephrosis. Urinary bladder decompressed and unremarkable.

Stomach/Bowel: Stomach distended with fluid. Numerous dilated loops
of small bowel throughout the abdomen and pelvis. Right inguinal
hernia containing small bowel, right inguinal hernia containing a
dilated loop of small bowel, with fluid in the hernia sac adjacent
to the dilated loop. The small bowel distal to the hernia is
decompressed. Diffuse colonic diverticulosis without evidence of
acute diverticulitis. Moderate diffuse colonic stool burden and
large stool burden in the rectum. Normal appendix in the right upper
pelvis.

Vascular/Lymphatic: Moderate to severe aortoiliofemoral
atherosclerosis without evidence of aneurysm. Normal-appearing
portal venous and systemic venous systems.

No pathologic lymphadenopathy.

Reproductive: Moderate to marked prostate gland enlargement,
particularly the median lobe. Normal seminal vesicles.

Other: None.

Musculoskeletal: Multilevel degenerative disc disease, spondylosis
and facet degenerative changes throughout the lower thoracic and
lumbar spine, worst at L1-2 and L2-3. Osseous demineralization.
Thoracolumbar levoscoliosis. Benign bone island in the first left
sacral ala. No acute osseous abnormality.
IMPRESSION: 1. Partial small bowel obstruction related to obstruction of a loop
of bowel within a right inguinal hernia. No free intraperitoneal
air.
2. Pneumonia involving the visualized lung bases bilaterally.
Apparent nodules in the right middle lobe and lingula are felt to
represent airspace disease rather than true nodules. A follow-up CT
chest after treatment may be helpful to confirm resolution.
3. Diffuse colonic diverticulosis without evidence of acute
diverticulitis. Moderate diffuse colonic stool burden and large
rectal stool burden.
4. Benign hemangioma involving the medial segment left lobe of
liver.
5. Mild cardiomegaly.  Severe aortic valvular calcification.
6. Pancreatic atrophy.
7. Moderate to marked prostate gland enlargement, particularly the
median lobe.
8.  Aortic Atherosclerosis ([id number])
